# Patient Record
Sex: Female | Born: 1990 | Race: Black or African American | Hispanic: No | Marital: Single | Smoking: Never smoker
Health system: Southern US, Community
[De-identification: ages and names within clinical notes are randomized; demographics above are authoritative.]

## PROBLEM LIST (undated history)

## (undated) DIAGNOSIS — T7840XA Allergy, unspecified, initial encounter: Secondary | ICD-10-CM

## (undated) DIAGNOSIS — E785 Hyperlipidemia, unspecified: Secondary | ICD-10-CM

## (undated) DIAGNOSIS — E119 Type 2 diabetes mellitus without complications: Secondary | ICD-10-CM

## (undated) DIAGNOSIS — K219 Gastro-esophageal reflux disease without esophagitis: Secondary | ICD-10-CM

## (undated) HISTORY — DX: Gastro-esophageal reflux disease without esophagitis: K21.9

## (undated) HISTORY — DX: Hyperlipidemia, unspecified: E78.5

## (undated) HISTORY — DX: Morbid (severe) obesity due to excess calories: E66.01

## (undated) HISTORY — DX: Allergy, unspecified, initial encounter: T78.40XA

## (undated) HISTORY — DX: Type 2 diabetes mellitus without complications: E11.9

---

## 2013-04-18 ENCOUNTER — Encounter (HOSPITAL_COMMUNITY): Payer: Self-pay | Admitting: *Deleted

## 2013-04-18 ENCOUNTER — Emergency Department (HOSPITAL_COMMUNITY)
Admission: EM | Admit: 2013-04-18 | Discharge: 2013-04-18 | Disposition: A | Discharge status: Home / Self Care | Payer: Self-pay | Attending: Emergency Medicine | Admitting: Emergency Medicine

## 2013-04-18 DIAGNOSIS — Z201 Contact with and (suspected) exposure to tuberculosis: Secondary | ICD-10-CM

## 2013-04-18 DIAGNOSIS — Z0389 Encounter for observation for other suspected diseases and conditions ruled out: Secondary | ICD-10-CM | POA: Insufficient documentation

## 2013-04-18 NOTE — ED Provider Notes (Signed)
  History    This chart was scribed for a non-physician practitioner working with Raeford Razor, MD by Jiles Prows, ED scribe. This patient was seen in room WTR9/WTR9 and the patient's care was started at 9:59 PM.  CSN: [id number] Arrival date & time 04/18/13  2019   Chief Complaint  Patient presents with  . ?TB exposure    The history is provided by the patient and medical records. No language interpreter was used.   HPI Comments: Debra [name] is a 22 y.o. female who presents to the Emergency Department complaining of possible exposure to TB.  She states she was in close contact with someone who has TB in the beginning of May at a family event.  She feels well. She denies night sweats, fever, weight loss, headache, diaphoresis, fever, chills, nausea, vomiting, diarrhea, weakness, cough, SOB and any other pain.   History reviewed. No pertinent past medical history. History reviewed. No pertinent past surgical history. No family history on file. History  Substance Use Topics  . Smoking status: Never Smoker   . Smokeless tobacco: Never Used  . Alcohol Use: No   OB History   Grav Para Term Preterm Abortions TAB SAB Ect Mult Living                 Review of Systems  All other systems reviewed and are negative.    Allergies  Review of patient's allergies indicates no known allergies.  Home Medications  No current outpatient prescriptions on file. BP 123/85  Pulse 69  Temp(Src) 98.6 F (37 C) (Oral)  Resp 18  Ht 5\' 6"  (1.676 m)  SpO2 100% Physical Exam  Nursing note and vitals reviewed. Constitutional: She is oriented to person, place, and time. She appears well-developed and well-nourished. No distress.  HENT:  Head: Normocephalic and atraumatic.  Right Ear: External ear normal.  Left Ear: External ear normal.  Nose: Nose normal.  Mouth/Throat: Oropharynx is clear and moist.  Eyes: Conjunctivae are normal.  Neck: Normal range of motion.  Cardiovascular: Normal  rate, regular rhythm and normal heart sounds.   Pulmonary/Chest: Effort normal and breath sounds normal. No stridor. No respiratory distress. She has no wheezes. She has no rales.  Abdominal: Soft. She exhibits no distension.  Musculoskeletal: Normal range of motion.  Neurological: She is alert and oriented to person, place, and time. She has normal strength.  Skin: Skin is warm and dry. She is not diaphoretic. No erythema.  Psychiatric: She has a normal mood and affect. Her behavior is normal.    ED Course  Procedures (including critical care time) DIAGNOSTIC STUDIES: Oxygen Saturation is 100% on RA, normal by my interpretation.    COORDINATION OF CARE: 10:00 PM - Discussed ED treatment with pt at bedside including TB test at health department and pt agrees.     Labs Reviewed - No data to display No results found. 1. Tuberculosis contact     MDM  Patient asymptomatic, physical exam WNL. Discussed that she needs to go to the health department for TB test. Return instructions given. Vital signs stable for discharge. Patient / Family / Caregiver informed of clinical course, understand medical decision-making process, and agree with plan.   I personally performed the services described in this documentation, which was scribed in my presence. The recorded information has been reviewed and is accurate.     Mora Bellman, PA-C 04/18/13 2230

## 2013-04-18 NOTE — ED Notes (Signed)
Pt reports she was at a family event in [place] at the beginning of May and a family member just called to tell her she may have been exposed to TB. Pt denies cough, fever, night sweats or weight loss

## 2013-04-18 NOTE — ED Provider Notes (Signed)
Medical screening examination/treatment/procedure(s) were performed by non-physician practitioner and as supervising physician I was immediately available for consultation/collaboration.  Danira Nylander, MD 04/18/13 2338 

## 2013-04-18 NOTE — ED Notes (Signed)
D/c home with contact info for health dept

## 2015-05-26 ENCOUNTER — Emergency Department (HOSPITAL_COMMUNITY)
Admission: EM | Admit: 2015-05-26 | Discharge: 2015-05-26 | Disposition: A | Discharge status: Home / Self Care | Payer: Self-pay | Attending: Emergency Medicine | Admitting: Emergency Medicine

## 2015-05-26 ENCOUNTER — Encounter (HOSPITAL_COMMUNITY): Payer: Self-pay

## 2015-05-26 DIAGNOSIS — H6691 Otitis media, unspecified, right ear: Secondary | ICD-10-CM | POA: Insufficient documentation

## 2015-05-26 DIAGNOSIS — J029 Acute pharyngitis, unspecified: Secondary | ICD-10-CM | POA: Insufficient documentation

## 2015-05-26 DIAGNOSIS — R109 Unspecified abdominal pain: Secondary | ICD-10-CM | POA: Insufficient documentation

## 2015-05-26 DIAGNOSIS — Z3202 Encounter for pregnancy test, result negative: Secondary | ICD-10-CM | POA: Insufficient documentation

## 2015-05-26 LAB — URINALYSIS, ROUTINE W REFLEX MICROSCOPIC
Bilirubin Urine: NEGATIVE
GLUCOSE, UA: NEGATIVE mg/dL
KETONES UR: NEGATIVE mg/dL
Leukocytes, UA: NEGATIVE
Nitrite: NEGATIVE
PROTEIN: NEGATIVE mg/dL
Specific Gravity, Urine: 1.027 (ref 1.005–1.030)
UROBILINOGEN UA: 4 mg/dL — AB (ref 0.0–1.0)
pH: 7 (ref 5.0–8.0)

## 2015-05-26 LAB — POC URINE PREG, ED: Preg Test, Ur: NEGATIVE

## 2015-05-26 LAB — URINE MICROSCOPIC-ADD ON

## 2015-05-26 MED ORDER — AMOXICILLIN 500 MG PO CAPS: 500.0000 mg | ORAL_CAPSULE | Freq: Three times a day (TID) | ORAL | Status: DC

## 2015-05-26 NOTE — ED Notes (Addendum)
Pt states ear pain, sore pain since Saturday night.  No fever.  Difficult swallowing.  Pt also states flank pain started Wednesday night.  No difficulty urinating. No change in urine color/odor.  Pt also states menstrual period abnormal this month.  Only lasted x 2 days.

## 2015-05-26 NOTE — ED Notes (Signed)
Bed: [facility] Expected date:  Expected time:  Means of arrival:  Comments: Bleach clean

## 2015-05-26 NOTE — ED Provider Notes (Signed)
CSN: [id number]     Arrival date & time 05/26/15  6213 History   First MD Initiated Contact with Patient 05/26/15 [phone]     Chief Complaint  Patient presents with  . Otalgia  . Flank Pain     (Consider location/radiation/quality/duration/timing/severity/associated sxs/prior Treatment) HPI    PCP: No primary care provider on file. Blood pressure 151/74, pulse 103, temperature 98.9 F (37.2 C), temperature source Oral, resp. rate 18, last menstrual period 05/22/2015, SpO2 99 %.  Debra [name] is a 24 y.o.female without any significant PMH presents to the ER with complaints of sore throat, right ear pain and generalized back pain. Her symptoms started on Saturday. 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 She denies  Vaginal discharge, dysuria. She is having abnormal vaginal bleeding. nO fevers. She is having difficulty swallowing due to the pain. She has not tried any medications at home. Her back pain is aching and worse with touching and certain movements. Denies pain at rest.   The patient denies diaphoresis, fever, headache, weakness (general or focal), confusion, change of vision,  neck pain,  aphagia, chest pain, shortness of breath,  back pain, abdominal pains, nausea, vomiting, diarrhea, lower extremity swelling, rash  History reviewed. No pertinent past medical history. History reviewed. No pertinent past surgical history. History reviewed. No pertinent family history. Social History  Substance Use Topics  . Smoking status: Never Smoker   . Smokeless tobacco: Never Used  . Alcohol Use: No   OB History    No data available     Review of Systems  10 Systems reviewed and are negative for acute change except as noted in the HPI.     Allergies  Review of patient's allergies indicates no known allergies.  Home Medications   Prior to Admission medications   Medication Sig Start Date End Date Taking? Authorizing Provider  ibuprofen  (ADVIL,MOTRIN) 200 MG tablet Take 400 mg by mouth every 6 (six) hours as needed for headache, mild pain or moderate pain.   Yes Historical Provider, MD  amoxicillin (AMOXIL) 500 MG capsule Take 1 capsule (500 mg total) by mouth 3 (three) times daily. 05/26/15   Zamarian Scarano Neva Seat, PA-C   BP 151/74 mmHg  Pulse 103  Temp(Src) 98.9 F (37.2 C) (Oral)  Resp 18  SpO2 99%  LMP 05/22/2015 Physical Exam  Constitutional: She appears well-developed and well-nourished. No distress.  HENT:  Head: Normocephalic and atraumatic.  Right Ear: Ear canal normal. Tympanic membrane is injected.  Left Ear: Tympanic membrane and ear canal normal.  Nose: Nose normal.  Mouth/Throat: Posterior oropharyngeal edema and posterior oropharyngeal erythema present. No oropharyngeal exudate.  Eyes: Pupils are equal, round, and reactive to light.  Neck: Normal range of motion. Neck supple.  Cardiovascular: Normal rate and regular rhythm.   Pulmonary/Chest: Effort normal.  Abdominal: Soft. Bowel sounds are normal. She exhibits no distension (exam limited by body habitus). There is no hepatosplenomegaly. There is no tenderness. There is no rigidity, no rebound, no guarding and no CVA tenderness.  Neurological: She is alert.  Skin: Skin is warm and dry.  Nursing note and vitals reviewed.   ED Course  Procedures (including critical care time) Labs Review Labs Reviewed  URINALYSIS, ROUTINE W REFLEX MICROSCOPIC (NOT AT [facility]) - Abnormal; Notable for the following:    Color, Urine AMBER (*)    APPearance CLOUDY (*)    Hgb urine dipstick TRACE (*)    Urobilinogen, UA 4.0 (*)  All other components within normal limits  URINE MICROSCOPIC-ADD ON - Abnormal; Notable for the following:    Squamous Epithelial / LPF MANY (*)    Bacteria, UA MANY (*)    All other components within normal limits  URINE CULTURE  POC URINE PREG, ED    Imaging Review No results found. I have personally reviewed and evaluated these images and  lab results as part of my medical decision-making.   EKG Interpretation None      MDM   Final diagnoses:  Acute right otitis media, recurrence not specified, unspecified otitis media type  Sore throat    Patients urinalysis is normal, small amount of blood in urine which is suspected to be from menstrual cycle.  Patient with infected right ear. No systemic symptoms head N/V/D, headaches, fevers, weakness.  Rx: amoxicillin  Medications - No data to display  24 y.o.Debra [name]'s evaluation in the Emergency Department is complete. It has been determined that no acute conditions requiring further emergency intervention are present at this time. The patient/guardian have been advised of the diagnosis and plan. We have discussed signs and symptoms that warrant return to the ED, such as changes or worsening in symptoms.  Vital signs are stable at discharge. Filed Vitals:   05/26/15 0934  BP: 151/74  Pulse: 103  Temp: 98.9 F (37.2 C)  Resp: 18    Patient/guardian has voiced understanding and agreed to follow-up with the PCP or specialist.    Marlon Pel, PA-C 05/26/15 1135  Blane Ohara, MD 05/28/15 [phone]

## 2015-05-26 NOTE — Discharge Instructions (Signed)
Pharyngitis °Pharyngitis is redness, pain, and swelling (inflammation) of your pharynx.  °CAUSES  °Pharyngitis is usually caused by infection. Most of the time, these infections are from viruses (viral) and are part of a cold. However, sometimes pharyngitis is caused by bacteria (bacterial). Pharyngitis can also be caused by allergies. Viral pharyngitis may be spread from person to person by coughing, sneezing, and personal items or utensils (cups, forks, spoons, toothbrushes). Bacterial pharyngitis may be spread from person to person by more intimate contact, such as kissing.  °SIGNS AND SYMPTOMS  °Symptoms of pharyngitis include:   °· Sore throat.   °· Tiredness (fatigue).   °· Low-grade fever.   °· Headache. °· Joint pain and muscle aches. °· Skin rashes. °· Swollen lymph nodes. °· Plaque-like film on throat or tonsils (often seen with bacterial pharyngitis). °DIAGNOSIS  °Your health care provider will ask you questions about your illness and your symptoms. Your medical history, along with a physical exam, is often all that is needed to diagnose pharyngitis. Sometimes, a rapid strep test is done. Other lab tests may also be done, depending on the suspected cause.  °TREATMENT  °Viral pharyngitis will usually get better in 3-4 days without the use of medicine. Bacterial pharyngitis is treated with medicines that kill germs (antibiotics).  °HOME CARE INSTRUCTIONS  °· Drink enough water and fluids to keep your urine clear or pale yellow.   °· Only take over-the-counter or prescription medicines as directed by your health care provider:   °¨ If you are prescribed antibiotics, make sure you finish them even if you start to feel better.   °¨ Do not take aspirin.   °· Get lots of rest.   °· Gargle with 8 oz of salt water (½ tsp of salt per 1 qt of water) as often as every 1-2 hours to soothe your throat.   °· Throat lozenges (if you are not at risk for choking) or sprays may be used to soothe your throat. °SEEK MEDICAL  CARE IF:  °· You have large, tender lumps in your neck. °· You have a rash. °· You cough up green, yellow-Snader, or bloody spit. °SEEK IMMEDIATE MEDICAL CARE IF:  °· Your neck becomes stiff. °· You drool or are unable to swallow liquids. °· You vomit or are unable to keep medicines or liquids down. °· You have severe pain that does not go away with the use of recommended medicines. °· You have trouble breathing (not caused by a stuffy nose). °MAKE SURE YOU:  °· Understand these instructions. °· Will watch your condition. °· Will get help right away if you are not doing well or get worse. °Document Released: 09/20/2005 Document Revised: 07/11/2013 Document Reviewed: 05/28/2013 °[employer]® Patient Information ©2015 [employer]. This information is not intended to replace advice given to you by your health care provider. Make sure you discuss any questions you have with your health care provider. °Otitis Media °Otitis media is redness, soreness, and inflammation of the middle ear. Otitis media may be caused by allergies or, most commonly, by infection. Often it occurs as a complication of the common cold. °SIGNS AND SYMPTOMS °Symptoms of otitis media may include: °· Earache. °· Fever. °· Ringing in your ear. °· Headache. °· Leakage of fluid from the ear. °DIAGNOSIS °To diagnose otitis media, your health care provider will examine your ear with an otoscope. This is an instrument that allows your health care provider to see into your ear in order to examine your eardrum. Your health care provider also will ask you questions about your   symptoms. °TREATMENT  °Typically, otitis media resolves on its own within 3-5 days. Your health care provider may prescribe medicine to ease your symptoms of pain. If otitis media does not resolve within 5 days or is recurrent, your health care provider may prescribe antibiotic medicines if he or she suspects that a bacterial infection is the cause. °HOME CARE INSTRUCTIONS  °· If you were  prescribed an antibiotic medicine, finish it all even if you start to feel better. °· Take medicines only as directed by your health care provider. °· Keep all follow-up visits as directed by your health care provider. °SEEK MEDICAL CARE IF: °· You have otitis media only in one ear, or bleeding from your nose, or both. °· You notice a lump on your neck. °· You are not getting better in 3-5 days. °· You feel worse instead of better. °SEEK IMMEDIATE MEDICAL CARE IF:  °· You have pain that is not controlled with medicine. °· You have swelling, redness, or pain around your ear or stiffness in your neck. °· You notice that part of your face is paralyzed. °· You notice that the bone behind your ear (mastoid) is tender when you touch it. °MAKE SURE YOU:  °· Understand these instructions. °· Will watch your condition. °· Will get help right away if you are not doing well or get worse. °Document Released: 06/25/2004 Document Revised: 02/04/2014 Document Reviewed: 04/17/2013 °[employer]® Patient Information ©2015 [employer]. This information is not intended to replace advice given to you by your health care provider. Make sure you discuss any questions you have with your health care provider. ° °

## 2015-05-27 LAB — URINE CULTURE: SPECIAL REQUESTS: NORMAL

## 2015-10-05 ENCOUNTER — Encounter (HOSPITAL_COMMUNITY): Payer: Self-pay

## 2015-10-05 ENCOUNTER — Emergency Department (HOSPITAL_COMMUNITY)
Admission: EM | Admit: 2015-10-05 | Discharge: 2015-10-05 | Disposition: A | Discharge status: Home / Self Care | Payer: No Typology Code available for payment source | Attending: Emergency Medicine | Admitting: Emergency Medicine

## 2015-10-05 ENCOUNTER — Emergency Department (HOSPITAL_COMMUNITY): Payer: No Typology Code available for payment source

## 2015-10-05 DIAGNOSIS — S161XXA Strain of muscle, fascia and tendon at neck level, initial encounter: Secondary | ICD-10-CM | POA: Insufficient documentation

## 2015-10-05 DIAGNOSIS — Z3202 Encounter for pregnancy test, result negative: Secondary | ICD-10-CM | POA: Insufficient documentation

## 2015-10-05 DIAGNOSIS — Y9389 Activity, other specified: Secondary | ICD-10-CM | POA: Diagnosis not present

## 2015-10-05 DIAGNOSIS — S0990XA Unspecified injury of head, initial encounter: Secondary | ICD-10-CM | POA: Diagnosis not present

## 2015-10-05 DIAGNOSIS — Y92410 Unspecified street and highway as the place of occurrence of the external cause: Secondary | ICD-10-CM | POA: Diagnosis not present

## 2015-10-05 DIAGNOSIS — Y998 Other external cause status: Secondary | ICD-10-CM | POA: Diagnosis not present

## 2015-10-05 DIAGNOSIS — S199XXA Unspecified injury of neck, initial encounter: Secondary | ICD-10-CM | POA: Diagnosis present

## 2015-10-05 LAB — POC URINE PREG, ED: Preg Test, Ur: NEGATIVE

## 2015-10-05 MED ORDER — NAPROXEN 500 MG PO TABS: 500.0000 mg | ORAL_TABLET | Freq: Two times a day (BID) | ORAL | Status: DC

## 2015-10-05 MED ORDER — METHOCARBAMOL 500 MG PO TABS
500.0000 mg | ORAL_TABLET | Freq: Once | ORAL | Status: DC
  Filled 2015-10-05: qty 1

## 2015-10-05 MED ORDER — NAPROXEN 500 MG PO TABS
500.0000 mg | ORAL_TABLET | Freq: Once | ORAL | Status: AC
  Administered 2015-10-05: 500 mg via ORAL
  Filled 2015-10-05: qty 1

## 2015-10-05 MED ORDER — METHOCARBAMOL 500 MG PO TABS: 500.0000 mg | ORAL_TABLET | Freq: Two times a day (BID) | ORAL | Status: DC

## 2015-10-05 NOTE — Discharge Instructions (Signed)
Cervical Strain and Sprain With Rehab  Cervical strain and sprain are injuries that commonly occur with "whiplash" injuries. Whiplash occurs when the neck is forcefully whipped backward or forward, such as during a motor vehicle accident or during contact sports. The muscles, ligaments, tendons, discs, and nerves of the neck are susceptible to injury when this occurs.  RISK FACTORS  Risk of having a whiplash injury increases if:  · Osteoarthritis of the spine.  · Situations that make head or neck accidents or trauma more likely.  · High-risk sports (football, rugby, wrestling, hockey, auto racing, gymnastics, diving, contact karate, or boxing).  · Poor strength and flexibility of the neck.  · Previous neck injury.  · Poor tackling technique.  · Improperly fitted or padded equipment.  SYMPTOMS   · Pain or stiffness in the front or back of neck or both.  · Symptoms may present immediately or up to 24 hours after injury.  · Dizziness, headache, nausea, and vomiting.  · Muscle spasm with soreness and stiffness in the neck.  · Tenderness and swelling at the injury site.  PREVENTION  · Learn and use proper technique (avoid tackling with the head, spearing, and head-butting; use proper falling techniques to avoid landing on the head).  · Warm up and stretch properly before activity.  · Maintain physical fitness:    Strength, flexibility, and endurance.    Cardiovascular fitness.  · Wear properly fitted and padded protective equipment, such as padded soft collars, for participation in contact sports.  PROGNOSIS   Recovery from cervical strain and sprain injuries is dependent on the extent of the injury. These injuries are usually curable in 1 week to 3 months with appropriate treatment.   RELATED COMPLICATIONS   · Temporary numbness and weakness may occur if the nerve roots are damaged, and this may persist until the nerve has completely healed.  · Chronic pain due to frequent recurrence of symptoms.  · Prolonged healing,  especially if activity is resumed too soon (before complete recovery).  TREATMENT   Treatment initially involves the use of ice and medication to help reduce pain and inflammation. It is also important to perform strengthening and stretching exercises and modify activities that worsen symptoms so the injury does not get worse. These exercises may be performed at home or with a therapist. For patients who experience severe symptoms, a soft, padded collar may be recommended to be worn around the neck.   Improving your posture may help reduce symptoms. Posture improvement includes pulling your chin and abdomen in while sitting or standing. If you are sitting, sit in a firm chair with your buttocks against the back of the chair. While sleeping, try replacing your pillow with a small towel rolled to 2 inches in diameter, or use a cervical pillow or soft cervical collar. Poor sleeping positions delay healing.   For patients with nerve root damage, which causes numbness or weakness, the use of a cervical traction apparatus may be recommended. Surgery is rarely necessary for these injuries. However, cervical strain and sprains that are present at birth (congenital) may require surgery.  MEDICATION   · If pain medication is necessary, nonsteroidal anti-inflammatory medications, such as aspirin and ibuprofen, or other minor pain relievers, such as acetaminophen, are often recommended.  · Do not take pain medication for 7 days before surgery.  · Prescription pain relievers may be given if deemed necessary by your caregiver. Use only as directed and only as much as you need.    HEAT AND COLD:   · Cold treatment (icing) relieves pain and reduces inflammation. Cold treatment should be applied for 10 to 15 minutes every 2 to 3 hours for inflammation and pain and immediately after any activity that aggravates your symptoms. Use ice packs or an ice massage.  · Heat treatment may be used prior to performing the stretching and  strengthening activities prescribed by your caregiver, physical therapist, or athletic trainer. Use a heat pack or a warm soak.  SEEK MEDICAL CARE IF:   · Symptoms get worse or do not improve in 2 weeks despite treatment.  · New, unexplained symptoms develop (drugs used in treatment may produce side effects).  EXERCISES  RANGE OF MOTION (ROM) AND STRETCHING EXERCISES - Cervical Strain and Sprain  These exercises may help you when beginning to rehabilitate your injury. In order to successfully resolve your symptoms, you must improve your posture. These exercises are designed to help reduce the forward-head and rounded-shoulder posture which contributes to this condition. Your symptoms may resolve with or without further involvement from your physician, physical therapist or athletic trainer. While completing these exercises, remember:   · Restoring tissue flexibility helps normal motion to return to the joints. This allows healthier, less painful movement and activity.  · An effective stretch should be held for at least 20 seconds, although you may need to begin with shorter hold times for comfort.  · A stretch should never be painful. You should only feel a gentle lengthening or release in the stretched tissue.  STRETCH- Axial Extensors  · Lie on your back on the floor. You may bend your knees for comfort. Place a rolled-up hand towel or dish towel, about 2 inches in diameter, under the part of your head that makes contact with the floor.  · Gently tuck your chin, as if trying to make a "double chin," until you feel a gentle stretch at the base of your head.  · Hold __________ seconds.  Repeat __________ times. Complete this exercise __________ times per day.   STRETCH - Axial Extension   · Stand or sit on a firm surface. Assume a good posture: chest up, shoulders drawn back, abdominal muscles slightly tense, knees unlocked (if standing) and feet hip width apart.  · Slowly retract your chin so your head slides back  and your chin slightly lowers. Continue to look straight ahead.  · You should feel a gentle stretch in the back of your head. Be certain not to feel an aggressive stretch since this can cause headaches later.  · Hold for __________ seconds.  Repeat __________ times. Complete this exercise __________ times per day.  STRETCH - Cervical Side Bend   · Stand or sit on a firm surface. Assume a good posture: chest up, shoulders drawn back, abdominal muscles slightly tense, knees unlocked (if standing) and feet hip width apart.  · Without letting your nose or shoulders move, slowly tip your right / left ear to your shoulder until your feel a gentle stretch in the muscles on the opposite side of your neck.  · Hold __________ seconds.  Repeat __________ times. Complete this exercise __________ times per day.  STRETCH - Cervical Rotators   · Stand or sit on a firm surface. Assume a good posture: chest up, shoulders drawn back, abdominal muscles slightly tense, knees unlocked (if standing) and feet hip width apart.  · Keeping your eyes level with the ground, slowly turn your head until you feel a gentle stretch along   the back and opposite side of your neck.  · Hold __________ seconds.  Repeat __________ times. Complete this exercise __________ times per day.  RANGE OF MOTION - Neck Circles   · Stand or sit on a firm surface. Assume a good posture: chest up, shoulders drawn back, abdominal muscles slightly tense, knees unlocked (if standing) and feet hip width apart.  · Gently roll your head down and around from the back of one shoulder to the back of the other. The motion should never be forced or painful.  · Repeat the motion 10-20 times, or until you feel the neck muscles relax and loosen.  Repeat __________ times. Complete the exercise __________ times per day.  STRENGTHENING EXERCISES - Cervical Strain and Sprain  These exercises may help you when beginning to rehabilitate your injury. They may resolve your symptoms with or  without further involvement from your physician, physical therapist, or athletic trainer. While completing these exercises, remember:   · Muscles can gain both the endurance and the strength needed for everyday activities through controlled exercises.  · Complete these exercises as instructed by your physician, physical therapist, or athletic trainer. Progress the resistance and repetitions only as guided.  · You may experience muscle soreness or fatigue, but the pain or discomfort you are trying to eliminate should never worsen during these exercises. If this pain does worsen, stop and make certain you are following the directions exactly. If the pain is still present after adjustments, discontinue the exercise until you can discuss the trouble with your clinician.  STRENGTH - Cervical Flexors, Isometric  · Face a wall, standing about 6 inches away. Place a small pillow, a ball about 6-8 inches in diameter, or a folded towel between your forehead and the wall.  · Slightly tuck your chin and gently push your forehead into the soft object. Push only with mild to moderate intensity, building up tension gradually. Keep your jaw and forehead relaxed.  · Hold 10 to 20 seconds. Keep your breathing relaxed.  · Release the tension slowly. Relax your neck muscles completely before you start the next repetition.  Repeat __________ times. Complete this exercise __________ times per day.  STRENGTH- Cervical Lateral Flexors, Isometric   · Stand about 6 inches away from a wall. Place a small pillow, a ball about 6-8 inches in diameter, or a folded towel between the side of your head and the wall.  · Slightly tuck your chin and gently tilt your head into the soft object. Push only with mild to moderate intensity, building up tension gradually. Keep your jaw and forehead relaxed.  · Hold 10 to 20 seconds. Keep your breathing relaxed.  · Release the tension slowly. Relax your neck muscles completely before you start the next  repetition.  Repeat __________ times. Complete this exercise __________ times per day.  STRENGTH - Cervical Extensors, Isometric   · Stand about 6 inches away from a wall. Place a small pillow, a ball about 6-8 inches in diameter, or a folded towel between the back of your head and the wall.  · Slightly tuck your chin and gently tilt your head back into the soft object. Push only with mild to moderate intensity, building up tension gradually. Keep your jaw and forehead relaxed.  · Hold 10 to 20 seconds. Keep your breathing relaxed.  · Release the tension slowly. Relax your neck muscles completely before you start the next repetition.  Repeat __________ times. Complete this exercise __________ times per day.    POSTURE AND BODY MECHANICS CONSIDERATIONS - Cervical Strain and Sprain  Keeping correct posture when sitting, standing or completing your activities will reduce the stress put on different body tissues, allowing injured tissues a chance to heal and limiting painful experiences. The following are general guidelines for improved posture. Your physician or physical therapist will provide you with any instructions specific to your needs. While reading these guidelines, remember:  · The exercises prescribed by your provider will help you have the flexibility and strength to maintain correct postures.  · The correct posture provides the optimal environment for your joints to work. All of your joints have less wear and tear when properly supported by a spine with good posture. This means you will experience a healthier, less painful body.  · Correct posture must be practiced with all of your activities, especially prolonged sitting and standing. Correct posture is as important when doing repetitive low-stress activities (typing) as it is when doing a single heavy-load activity (lifting).  PROLONGED STANDING WHILE SLIGHTLY LEANING FORWARD  When completing a task that requires you to lean forward while standing in one  place for a long time, place either foot up on a stationary 2- to 4-inch high object to help maintain the best posture. When both feet are on the ground, the low back tends to lose its slight inward curve. If this curve flattens (or becomes too large), then the back and your other joints will experience too much stress, fatigue more quickly, and can cause pain.   RESTING POSITIONS  Consider which positions are most painful for you when choosing a resting position. If you have pain with flexion-based activities (sitting, bending, stooping, squatting), choose a position that allows you to rest in a less flexed posture. You would want to avoid curling into a fetal position on your side. If your pain worsens with extension-based activities (prolonged standing, working overhead), avoid resting in an extended position such as sleeping on your stomach. Most people will find more comfort when they rest with their spine in a more neutral position, neither too rounded nor too arched. Lying on a non-sagging bed on your side with a pillow between your knees, or on your back with a pillow under your knees will often provide some relief. Keep in mind, being in any one position for a prolonged period of time, no matter how correct your posture, can still lead to stiffness.  WALKING  Walk with an upright posture. Your ears, shoulders, and hips should all line up.  OFFICE WORK  When working at a desk, create an environment that supports good, upright posture. Without extra support, muscles fatigue and lead to excessive strain on joints and other tissues.  CHAIR:  · A chair should be able to slide under your desk when your back makes contact with the back of the chair. This allows you to work closely.  · The chair's height should allow your eyes to be level with the upper part of your monitor and your hands to be slightly lower than your elbows.  · Body position:    Your feet should make contact with the floor. If this is not  possible, use a foot rest.    Keep your ears over your shoulders. This will reduce stress on your neck and low back.     This information is not intended to replace advice given to you by your health care provider. Make sure you discuss any questions you have with your health care provider.       Document Released: 09/20/2005 Document Revised: 10/11/2014 Document Reviewed: 01/02/2009  [employer]vier Interactive Patient Education ©2016 [employer]
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
## 2015-11-24 ENCOUNTER — Emergency Department (HOSPITAL_COMMUNITY)
Admission: EM | Admit: 2015-11-24 | Discharge: 2015-11-24 | Disposition: A | Discharge status: Home / Self Care | Payer: No Typology Code available for payment source | Attending: Emergency Medicine | Admitting: Emergency Medicine

## 2015-11-24 DIAGNOSIS — H9202 Otalgia, left ear: Secondary | ICD-10-CM | POA: Insufficient documentation

## 2015-11-24 DIAGNOSIS — Z791 Long term (current) use of non-steroidal anti-inflammatories (NSAID): Secondary | ICD-10-CM | POA: Insufficient documentation

## 2015-11-24 DIAGNOSIS — Z79899 Other long term (current) drug therapy: Secondary | ICD-10-CM | POA: Insufficient documentation

## 2015-11-24 DIAGNOSIS — J029 Acute pharyngitis, unspecified: Secondary | ICD-10-CM | POA: Insufficient documentation

## 2015-11-24 LAB — RAPID STREP SCREEN (MED CTR MEBANE ONLY): STREPTOCOCCUS, GROUP A SCREEN (DIRECT): NEGATIVE

## 2015-11-24 MED ORDER — IBUPROFEN 800 MG PO TABS: 800.0000 mg | ORAL_TABLET | Freq: Three times a day (TID) | ORAL | Status: DC

## 2015-11-24 NOTE — Discharge Instructions (Signed)
Your medication as prescribed. Continue drinking fluids at home to remain hydrated. You may also drink hot water or tea with honey in it for symptomatic relief. Please follow up with a primary care provider from the Resource Guide provided below in 3-4 days if your symptoms have note improved. Please return to the Emergency Department if symptoms worsen or new onset of fever, difficulty swallowing resulting in drooling, unable to open her jaw completely, difficulty breathing, facial or neck swelling.   Emergency Department Resource Guide 1) Find a Doctor and Pay Out of Pocket Although you won't have to find out who is covered by your insurance plan, it is a good idea to ask around and get recommendations. You will then need to call the office and see if the doctor you have chosen will accept you as a new patient and what types of options they offer for patients who are self-pay. Some doctors offer discounts or will set up payment plans for their patients who do not have insurance, but you will need to ask so you aren't surprised when you get to your appointment.  2) Contact Your Local Health Department Not all health departments have doctors that can see patients for sick visits, but many do, so it is worth a call to see if yours does. If you don't know where your local health department is, you can check in your phone book. The CDC also has a tool to help you locate your state's health department, and many state websites also have listings of all of their local health departments.  3) Find a Walk-in Clinic If your illness is not likely to be very severe or complicated, you may want to try a walk in clinic. These are popping up all over the country in pharmacies, drugstores, and shopping centers. They're usually staffed by nurse practitioners or physician assistants that have been trained to treat common illnesses and complaints. They're usually fairly quick and inexpensive. However, if you have serious  medical issues or chronic medical problems, these are probably not your best option.  No Primary Care Doctor: - Call Health Connect at  [phone] - they can help you locate a primary care doctor that  accepts your insurance, provides certain services, etc. - Physician Referral Service- [phone]  Chronic Pain Problems: Organization         Address  Phone   Notes  [facility]  [phone] Patients need to be referred by their primary care doctor.   Medication Assistance: Organization         Address  Phone   Notes  [facility] Medication [facility] [address] [place] [place], [place] [address] [phone] --Must be a resident of [facility] -- Must have NO insurance coverage whatsoever (no Medicaid/ Medicare, etc.) -- The pt. MUST have a primary care doctor that directs their care regularly and follows them in the community   [facility]  [phone]   [employer]  [phone]    Agencies that provide inexpensive medical care: Organization         Address  Phone   Notes  [facility]  [phone]   [facility]    [phone]   [facility] [address] [place], [place] [address] [phone]   [facility] [address] [place], [place] [phone]   Planned Parenthood    [phone]   [facility]    (  [phone]   [facility]  [address], [place] Phone:  [phone], Fax:  [phone] Hours of Operation:  9 am - 6 pm, M-F.  Also accepts Medicaid/Medicare and self-pay.  [facility]  [address], [place] Phone: [phone], Fax: [phone]. Hours of Operation:  8:30 am - 5:30 pm, M-F.  Also accepts Medicaid and self-pay.  [facility] [place] [address], [place] Phone: [phone]   [facility] [address] Natasha Bence  [place], [place] [phone], Ext. [phone] Mondays & Thursdays: 7-9 AM.  First 15 patients are seen on a first come, first serve basis.    Medicaid-accepting [facility] Providers:  Organization         Address  Phone   Notes  [facility] [address], [place] [phone] Also accepts self-pay patients.  [facility] [address] Laurell Josephs [place], [place]  [phone]   [facility] [address], [place] [phone]   [facility] [address] [place] [phone]   Renaye Rakers [address], [place]   [phone] Only accepts [place] Access [place] patients after they have their name applied to their card.   Self-Pay (no insurance) in [facility]:  Organization         Address  Phone   Notes  Sickle Cell Patients, [facility] [address] [place], [place] [phone]   [facility] Care [address] [place], [place] [phone]   [facility]  [address], [place] [phone]   [facility]/Dr. Osei-Bonsu  [address], [place] or [address], [place] [phone] Phone number for both [place] and [place] locations is the same.  Urgent Medical and [facility] [address] [place] [phone]   [facility] [address], [place] or [address] [phone]   [facility] [address] [place] [phone], phone; [phone], fax Sees patients 1st and 3rd Saturday of every month.  Must not qualify for public or private insurance (i.e. Medicaid, Medicare, [place] Health Choice, Veterans' Benefits)  Household income should be no more than 200% of the poverty level The clinic cannot treat you if you are pregnant or think you are pregnant  Sexually  transmitted diseases are not treated at the clinic.    Dental Care: Organization         Address  Phone  Notes  [facility] Department of [facility] [address] [place], [place] [phone] Accepts children up to age 72 who are enrolled in [place] or [place] Health Choice; pregnant women with a Medicaid card; and children who have applied for Medicaid or [place] Health Choice, but were declined, whose parents can pay a reduced fee at time of service.  [facility]  [address], [place] [phone] Accepts children up to age 47 who are enrolled in [place] or [place] Health Choice; pregnant women with a Medicaid card; and children who have applied for Medicaid or [place] Health Choice, but were declined, whose parents can pay a reduced fee at time of service.  [facility]  [address] [place], [place] [phone] Patients are seen by appointment only. Walk-ins are not accepted. [facility] will see patients 69 years of age and older. Monday - Tuesday (8am-5pm) Most Wednesdays (8:30-5pm) $30 per visit, cash only  [facility] Adult Dental Access PROGRAM  [address], [facility] [phone] Patients are seen by appointment only. Walk-ins are not accepted. [facility] will see patients 85 years of age and older. One Wednesday Evening (Monthly: Volunteer Based).  $30 per visit, cash only  [employer]  [phone] for adults; Children under age 32, call Graduate Pediatric Dentistry at [phone]. Children aged 68-14, please call [phone] to request a pediatric application.  Dental services are provided in all areas of dental care including fillings, crowns and bridges, complete and partial dentures, implants, gum treatment, root canals, and extractions. Preventive care is also provided. Treatment is provided to both adults and children. Patients are  selected via a lottery and there is often a waiting list.   [facility] [address], [place]  [phone] [email or web address]   [facility] [address] [place], [place] [phone], Ext. [phone] Second and Fourth Thursday of each month, opens at 6:30 AM; Clinic ends at 9 AM.  Patients are seen on a first-come first-served basis, and a limited number are seen during each clinic.   [facility]  [address] Ether Griffins [place], [place] [phone]   Eligibility Requirements You must have lived in [place], [place], or [place] counties for at least the last three months.   You cannot be eligible for state or federal sponsored [place], including [employer], [place], or [employer].   You generally cannot be eligible for healthcare insurance through your employer.    How to apply: Eligibility screenings are held every Tuesday and Wednesday afternoon from 1:00 pm until 4:00 pm. You do not need an appointment for the interview!  [facility] [address] [place], [place] [phone]   [facility] Health Department  [phone]   [facility] Health Department  [phone]   [facility] Health Department  [phone]    Behavioral Health Resources in the Community: Intensive Outpatient Programs Organization         Address  Phone  Notes  [facility] [address] [place], [place] [phone]   [facility] [address] [place], [place] [phone]   [facility] [address] [place], [place]  [phone]   [facility] [address],  [place], [place] [phone] or [phone]   Substance Abuse Resources Organization         Address  Phone  Notes  [facility]  [phone]   [facility]  [phone]   [facility] [place]  [phone]   Floydene Flock  6076286889     Residential & Outpatient Substance Abuse Program  [phone]   Psychological Services Organization         Address  Phone  Notes  [facility]  [phone]   [facility]  [phone]   [facility] [address] [place] [phone] or [phone]    Mobile Crisis Teams Organization         Address  Phone  Notes  [facility], Mobile Crisis Care Unit  [phone]   [facility]  [address]  [place], [place] [phone]   Doristine Locks [address] [place] [phone]    Self-Help/Support Groups Organization         Address  Phone             Notes  [facility] [place] - variety of support groups  336- I7437963 Call for more information  Narcotics Anonymous (NA), [facility] [address], [employer] [place]  2 meetings at this location   Statistician         Address  Phone  Notes  [facility] [address],    [place]  [phone]   [facility]  [address] [place] [address], [place], [place] [phone]   [facility] [address] [place], [place] [phone] Admissions: 8am-3pm M-F  [facility] [address],    [place], [place] [phone]   [facility] [address] [place], [place], [place] [phone]   [facility] [address],  [place], [place] [phone]   [facility] - Intensive Outpatient [address]ure[address] Josephs [address], [place], [place] [phone]   [facility] ([facility] [address] [place]  [place], [place] [phone] or 2487789878   [facility] ([facility]) [address], [place], [place] [phone] Accepts Medicaid  [facility] [address],  [place] [phone] Substance Abuse/Addiction Treatment   [facility] Organization         Address  Phone  Notes  [facility]  [phone]   Angie Fava, PhD [address] Ervin Knack [place], [place]   [phone] or [phone]   [facility] Behavioral   [address] [place], [place] [phone]   [facility] [address], [place], [place] [phone] Insurance/Medicaid/sponsorship through [facility] and Families [address]                                    [place], [place] [phone] Therapy/tele-psych/case  [facility] [address], [place] [phone]    Dr. Lolly Mustache  [phone]   [facility]  [employer] [facility] Dept. 1) [address] [place] 2) [address] [place] 3)  [address], [place] [phone]  [phone]   [facility] Child Abuse Hotline 651-438-5922 or [phone] (After Hours)

## 2015-11-24 NOTE — ED Notes (Signed)
Pt states that she has had a sore throat and L ear pain x 2 days. Has not tried anything OTC for pain relief. Alert and oriented.

## 2015-11-24 NOTE — ED Provider Notes (Signed)
CSN: [id number]     Arrival date & time 11/24/15  1709 History  By signing my name below, I, Debra [name], attest that this documentation has been prepared under the direction and in the presence of Barrett Henle, PA-C. Electronically Signed: Gonzella [name], ED Scribe. 11/24/2015. 6:12 PM.    Chief Complaint  Patient presents with  . Sore Throat  . Otalgia   The history is provided by the patient. No language interpreter was used.    HPI Comments: Debra [name] is a 25 y.o. female who presents to the Emergency Department complaining of sudden onset, gradually worsening sore throat which began yesterday and has worsened today. Pt also complains of pain with swallowing secondary to soar throat, and otalgia to her left ear which began yesterday. 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 Pt denies fever, nasal congestion, rhinorrhea, cough, difficulty breathing, facial swelling, change in hearing, ear drainage, and difficulty swallowing. Pt is otherwise healthy. Denies taking any medications prior to arrival.  No past medical history on file. No past surgical history on file. No family history on file. Social History  Substance Use Topics  . Smoking status: Never Smoker   . Smokeless tobacco: Never Used  . Alcohol Use: No   OB History    No data available     Review of Systems  Constitutional: Negative for fever.  HENT: Positive for ear pain and sore throat. Negative for congestion, ear discharge, facial swelling, hearing loss, rhinorrhea and trouble swallowing.   Respiratory: Negative for cough and shortness of breath.     Allergies  Review of patient's allergies indicates no known allergies.  Home Medications   Prior to Admission medications   Medication Sig Start Date End Date Taking? Authorizing Provider  amoxicillin (AMOXIL) 500 MG capsule Take 1 capsule (500 mg total) by mouth 3 (three) times daily. Patient not taking: Reported on 10/05/2015  05/26/15   Marlon Pel, PA-C  ibuprofen (ADVIL,MOTRIN) 800 MG tablet Take 1 tablet (800 mg total) by mouth 3 (three) times daily. 11/24/15   Barrett Henle, PA-C  methocarbamol (ROBAXIN) 500 MG tablet Take 1 tablet (500 mg total) by mouth 2 (two) times daily. 10/05/15   April Palumbo, MD  naproxen (NAPROSYN) 500 MG tablet Take 1 tablet (500 mg total) by mouth 2 (two) times daily. 10/05/15   April Palumbo, MD   BP 160/92 mmHg  Pulse 95  Temp(Src) 98 F (36.7 C) (Oral)  Resp 16  SpO2 99%  LMP 07/24/2015   Physical Exam  Constitutional: She is oriented to person, place, and time. She appears well-developed and well-nourished.  HENT:  Head: Normocephalic and atraumatic.  Right Ear: Tympanic membrane normal.  Left Ear: Tympanic membrane normal.  Nose: Nose normal. Right sinus exhibits no maxillary sinus tenderness and no frontal sinus tenderness. Left sinus exhibits no maxillary sinus tenderness and no frontal sinus tenderness.  Mouth/Throat: Uvula is midline and mucous membranes are normal. Oropharyngeal exudate ( white) and posterior oropharyngeal erythema present. No posterior oropharyngeal edema or tonsillar abscesses.  Eyes: Conjunctivae and EOM are normal. Right eye exhibits no discharge. Left eye exhibits no discharge. No scleral icterus.  Neck: Normal range of motion. Neck supple.  Cardiovascular: Normal rate, regular rhythm, normal heart sounds and intact distal pulses.   Pulmonary/Chest: Effort normal and breath sounds normal. No respiratory distress. She has no wheezes. She has no rales. She exhibits no tenderness.  Abdominal: Soft.  Musculoskeletal: Normal range of  motion.  Lymphadenopathy:    She has no cervical adenopathy.  Neurological: She is alert and oriented to person, place, and time.  Skin: Skin is warm and dry.  Psychiatric: She has a normal mood and affect.  Nursing note and vitals reviewed.   ED Course  Procedures  DIAGNOSTIC STUDIES: Oxygen Saturation  is 99% on RA, normal by my interpretation.    COORDINATION OF CARE: 6:09 PM Will order strep test.  Labs Review Labs Reviewed  RAPID STREP SCREEN (NOT AT [facility])  CULTURE, GROUP A STREP [facility])    I have personally reviewed and evaluated these lab results as part of my medical decision-making.   MDM   Final diagnoses:  Sore throat    Pt afebrile with tonsillar exudate, negative strep. Presents with no cervical lymphadenopathy, & positive dysphagia; diagnosis of viral pharyngitis. No abx indicated. DC w symptomatic tx for pain  Pt does not appear dehydrated, but did discuss importance of water rehydration. Presentation non concerning for PTA or infxn spread to soft tissue. No trismus or uvula deviation. Specific return precautions discussed. Pt able to drink water in ED without difficulty with intact air way. Recommended PCP follow up.  I personally performed the services described in this documentation, which was scribed in my presence. The recorded information has been reviewed and is accurate.    Debra [name], [place] 11/24/15 1917  [name], MD 11/24/15 [phone]

## 2015-11-27 LAB — CULTURE, GROUP A STREP (THRC)

## 2016-08-09 ENCOUNTER — Encounter (HOSPITAL_COMMUNITY): Payer: Self-pay | Admitting: *Deleted

## 2016-08-09 ENCOUNTER — Emergency Department (HOSPITAL_COMMUNITY)
Admission: EM | Admit: 2016-08-09 | Discharge: 2016-08-09 | Disposition: A | Discharge status: Home / Self Care | Payer: Self-pay | Attending: Emergency Medicine | Admitting: Emergency Medicine

## 2016-08-09 DIAGNOSIS — J020 Streptococcal pharyngitis: Secondary | ICD-10-CM | POA: Insufficient documentation

## 2016-08-09 LAB — RAPID STREP SCREEN (MED CTR MEBANE ONLY): STREPTOCOCCUS, GROUP A SCREEN (DIRECT): POSITIVE — AB

## 2016-08-09 MED ORDER — PENICILLIN V POTASSIUM 500 MG PO TABS
500.0000 mg | ORAL_TABLET | Freq: Once | ORAL | Status: AC
  Administered 2016-08-09: 500 mg via ORAL
  Filled 2016-08-09: qty 1

## 2016-08-09 MED ORDER — IBUPROFEN 600 MG PO TABS: 600.0000 mg | ORAL_TABLET | Freq: Four times a day (QID) | ORAL | 0 refills | Status: DC | PRN

## 2016-08-09 MED ORDER — PENICILLIN V POTASSIUM 500 MG PO TABS: 500.0000 mg | ORAL_TABLET | Freq: Four times a day (QID) | ORAL | 0 refills | Status: AC

## 2016-08-09 MED ORDER — ACETAMINOPHEN 500 MG PO TABS
1000.0000 mg | ORAL_TABLET | Freq: Once | ORAL | Status: AC
  Administered 2016-08-09: 1000 mg via ORAL
  Filled 2016-08-09: qty 2

## 2016-08-09 NOTE — ED Provider Notes (Signed)
[facility]-EMERGENCY DEPT Provider Note   CSN: [id number] Arrival date & time: 08/09/16  [id number]     History   Chief Complaint Chief Complaint  Patient presents with  . Sore Throat  . Otalgia    HPI Debra [name] is a 25 y.o. female.  HPI Debra [name] is a 25 y.o. female presents to emergency department complaining of sore throat, bilateral ear pain, headache, body aches. Pt states symptoms began 2 days ago. State she did not try anything for her symptoms at home. Denies congestion. No cough. No neck pain or stiffness. No ill contacts. Nothing making her symptoms better or worse. No n/v/d. No chest pain or abdominal pain. 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  History reviewed. No pertinent past medical history.  There are no active problems to display for this patient.   History reviewed. No pertinent surgical history.  OB History    No data available       Home Medications    Prior to Admission medications   Medication Sig Start Date End Date Taking? Authorizing Provider  amoxicillin (AMOXIL) 500 MG capsule Take 1 capsule (500 mg total) by mouth 3 (three) times daily. Patient not taking: Reported on 10/05/2015 05/26/15   Marlon Peliffany Greene, PA-C  ibuprofen (ADVIL,MOTRIN) 800 MG tablet Take 1 tablet (800 mg total) by mouth 3 (three) times daily. 11/24/15   Barrett HenleNicole Elizabeth Nadeau, PA-C  methocarbamol (ROBAXIN) 500 MG tablet Take 1 tablet (500 mg total) by mouth 2 (two) times daily. 10/05/15   April Palumbo, MD  naproxen (NAPROSYN) 500 MG tablet Take 1 tablet (500 mg total) by mouth 2 (two) times daily. 10/05/15   April Palumbo, MD    Family History No family history on file.  Social History Social History  Substance Use Topics  . Smoking status: Never Smoker  . Smokeless tobacco: Never Used  . Alcohol use No     Allergies   Patient has no known allergies.   Review of Systems Review of Systems  Constitutional: Positive for  chills.  HENT: Positive for ear pain and sore throat. Negative for congestion and ear discharge.   Respiratory: Negative for cough, chest tightness and shortness of breath.   Cardiovascular: Negative for chest pain, palpitations and leg swelling.  Gastrointestinal: Negative for abdominal pain, diarrhea, nausea and vomiting.  Genitourinary: Negative for dysuria and flank pain.  Musculoskeletal: Positive for myalgias. Negative for arthralgias, neck pain and neck stiffness.  Skin: Negative for rash.  Neurological: Positive for headaches. Negative for dizziness and weakness.  All other systems reviewed and are negative.    Physical Exam Updated Vital Signs BP 133/85   Pulse (!) 125   Temp 98.7 F (37.1 C) (Oral)   Resp 20   Ht 5\' 6"  (1.676 m)   Wt 124.2 kg   SpO2 97%   BMI 44.19 kg/m   Physical Exam  Constitutional: She appears well-developed and well-nourished. No distress.  HENT:  Head: Normocephalic.  External ear, ear canal, TMs normal bilaterally. Oropharynx erythematous, with exudate. Nose normal  Eyes: Conjunctivae are normal.  Neck: Neck supple.  Cardiovascular: Regular rhythm and normal heart sounds.  Tachycardia present.   Pulmonary/Chest: Effort normal and breath sounds normal. No respiratory distress. She has no wheezes. She has no rales.  Abdominal: Soft. Bowel sounds are normal. She exhibits no distension. There is no tenderness. There is no rebound.  Musculoskeletal: She exhibits no edema.  Lymphadenopathy:  She has no cervical adenopathy.  Neurological: She is alert.  Skin: Skin is warm and dry.  Psychiatric: She has a normal mood and affect. Her behavior is normal.  Nursing note and vitals reviewed.    ED Treatments / Results  Labs (all labs ordered are listed, but only abnormal results are displayed) Labs Reviewed  RAPID STREP SCREEN (NOT AT [facility]) - Abnormal; Notable for the following:       Result Value   Streptococcus, Group A Screen (Direct)  POSITIVE (*)    All other components within normal limits    EKG  EKG Interpretation None       Radiology No results found.  Procedures Procedures (including critical care time)  Medications Ordered in ED Medications  acetaminophen (TYLENOL) tablet 1,000 mg (not administered)     Initial Impression / Assessment and Plan / ED Course  I have reviewed the triage vital signs and the nursing notes.  Pertinent labs & imaging results that were available during my care of the patient were reviewed by me and considered in my medical decision making (see chart for details).  Clinical Course     Patient with sore throat, fever, positive rapid strep. She is tachycardic, febrile 102.4. Tylenol given for fever. Temperature down to 99. Heart rate improved, however still continues to be tachycardic. Instructed to increase fluids. She has no difficulty swallowing. No evidence of peritonsillar retropharyngeal abscess. She is nontoxic appearing. Home with Tylenol, ibuprofen, penicillin. She was offered a penicillin shot, however she refused. Instructed to do saltwater gargles. Close follow with primary care doctor. Return precautions discussed.   Final Clinical Impressions(s) / ED Diagnoses   Final diagnoses:  Strep pharyngitis    New Prescriptions New Prescriptions   IBUPROFEN (ADVIL,MOTRIN) 600 MG TABLET    Take 1 tablet (600 mg total) by mouth every 6 (six) hours as needed.   PENICILLIN V POTASSIUM (VEETID) 500 MG TABLET    Take 1 tablet (500 mg total) by mouth 4 (four) times daily.     Jaynie Crumbleatyana Havana Baldwin, PA-C 08/09/16 0827    Jaynie Crumbleatyana Awanda Wilcock, PA-C 08/09/16 [id number]    Laurence Spatesachel Morgan Little, MD 08/09/16 1547

## 2016-08-09 NOTE — ED Triage Notes (Signed)
Pt states that she has had a sore throat and bilateral ear pain for 2 days; pt denies URI sx

## 2016-08-09 NOTE — Discharge Instructions (Signed)
Drink plenty of fluids. Penicillin as prescribed until all gone. Salt water gargles. Tylenol and motrin for fever and sore throat. Follow up with family doctor.

## 2016-08-10 ENCOUNTER — Emergency Department (HOSPITAL_COMMUNITY)
Admission: EM | Admit: 2016-08-10 | Discharge: 2016-08-10 | Disposition: A | Discharge status: Home / Self Care | Payer: Self-pay | Attending: Emergency Medicine | Admitting: Emergency Medicine

## 2016-08-10 ENCOUNTER — Encounter (HOSPITAL_COMMUNITY): Payer: Self-pay

## 2016-08-10 DIAGNOSIS — J020 Streptococcal pharyngitis: Secondary | ICD-10-CM | POA: Insufficient documentation

## 2016-08-10 MED ORDER — ONDANSETRON 8 MG PO TBDP
8.0000 mg | ORAL_TABLET | Freq: Once | ORAL | Status: AC
  Administered 2016-08-10: 8 mg via ORAL
  Filled 2016-08-10: qty 1

## 2016-08-10 MED ORDER — PENICILLIN G BENZATHINE 1200000 UNIT/2ML IM SUSP
1.2000 10*6.[IU] | Freq: Once | INTRAMUSCULAR | Status: AC
  Administered 2016-08-10: 1.2 10*6.[IU] via INTRAMUSCULAR
  Filled 2016-08-10: qty 2

## 2016-08-10 NOTE — ED Triage Notes (Signed)
Pt was seen and dx with strep yesterday, she continues to feels worse and it's making her vomit, pt is taking her antibiotics

## 2016-08-10 NOTE — ED Provider Notes (Signed)
[facility]ER[facility] DEPT Provider Note   CSN: [id number] Arrival date & time: 08/10/16  0018  By signing my name below, I, Debra [name], attest that this documentation has been prepared under the direction and in the presence of Zadie Rhineonald Grettel Rames, MD. Electronically Signed: Angelene GiovanniEmmanuella [name], ED Scribe. 08/10/16. 4:07 AM.   History   Chief Complaint Chief Complaint  Patient presents with  . Sore Throat    HPI Comments: Debra [name] is a 25 y.o. female who presents to the Emergency Department complaining of multiple episodes of moderate non-bloody vomiting and non-bloody diarrhea onset yesterday s/p taking Penicillin. Pt was evaluated in the ED yesterday for persistent moderate sore throat, bilateral ear pain, headache and generalized body aches where she had a positive strep screen. She was discharged with ibuprofen and penicillin v potassium at that time. She states that she has returned because she has been vomiting and having diarrhea after taking the penicillin and ibuprofen. Pt has NKDA. She denies any fever, chills, or any other symptoms.    The history is provided by the patient. No language interpreter was used.  Sore Throat  This is a new problem. The current episode started yesterday. The problem has been gradually worsening. Nothing aggravates the symptoms. Nothing relieves the symptoms. Treatments tried: Ibuprofen and Penicillin. The treatment provided no relief.     PMH - none OB History    No data available       Home Medications    Prior to Admission medications   Medication Sig Start Date End Date Taking? Authorizing Provider  amoxicillin (AMOXIL) 500 MG capsule Take 1 capsule (500 mg total) by mouth 3 (three) times daily. Patient not taking: Reported on 10/05/2015 05/26/15   Marlon Peliffany Greene, PA-C  ibuprofen (ADVIL,MOTRIN) 600 MG tablet Take 1 tablet (600 mg total) by mouth every 6 (six) hours as needed. 08/09/16   Tatyana Kirichenko, PA-C  ibuprofen (ADVIL,MOTRIN)  800 MG tablet Take 1 tablet (800 mg total) by mouth 3 (three) times daily. 11/24/15   Barrett HenleNicole Elizabeth Nadeau, PA-C  methocarbamol (ROBAXIN) 500 MG tablet Take 1 tablet (500 mg total) by mouth 2 (two) times daily. 10/05/15   April Palumbo, MD  naproxen (NAPROSYN) 500 MG tablet Take 1 tablet (500 mg total) by mouth 2 (two) times daily. 10/05/15   April Palumbo, MD  penicillin v potassium (VEETID) 500 MG tablet Take 1 tablet (500 mg total) by mouth 4 (four) times daily. 08/09/16 08/19/16  Jaynie Crumbleatyana Kirichenko, PA-C    Family History History reviewed. No pertinent family history.  Social History Social History  Substance Use Topics  . Smoking status: Never Smoker  . Smokeless tobacco: Never Used  . Alcohol use No     Allergies   Patient has no known allergies.   Review of Systems Review of Systems  Constitutional: Negative for chills and fever.  HENT: Positive for sore throat.   Gastrointestinal: Positive for nausea and vomiting.  Musculoskeletal: Positive for myalgias.     Physical Exam Updated Vital Signs BP 112/89 (BP Location: Left Arm)   Pulse 108   Temp 98.7 F (37.1 C) (Oral)   Resp 17   Ht 5\' 6"  (1.676 m)   Wt 273 lb (123.8 kg)   SpO2 97%   BMI 44.06 kg/m   Physical Exam  Nursing note and vitals reviewed.  CONSTITUTIONAL: Well developed/well nourished HEAD: Normocephalic/atraumatic EYES: EOMI ENMT: Mucous membranes moist; uvula midline; exudates and erythema noted to tonsils, no drooling, no stridor NECK: supple no meningeal signs,  no significant cervical lymphadenopathy CV: S1/S2 noted, no murmurs/rubs/gallops noted LUNGS: Lungs are clear to auscultation bilaterally, no apparent distress NEURO: Pt is awake/alert/appropriate, moves all extremitiesx4.  No facial droop.   SKIN: warm, color normal PSYCH: no abnormalities of mood noted, alert and oriented to situation   ED Treatments / Results  DIAGNOSTIC STUDIES: Oxygen Saturation is 97% on RA, normal by my  interpretation.    COORDINATION OF CARE: 4:06 AM- Pt advised of plan for treatment and pt agrees. Pt will receive Penicillin IM and Zofran.   Labs (all labs ordered are listed, but only abnormal results are displayed) Labs Reviewed - No data to display  EKG  EKG Interpretation None       Radiology No results found.  Procedures Procedures (including critical care time)  Medications Ordered in ED Medications - No data to display   Initial Impression / Assessment and Plan / ED Course  Zadie Rhineonald Jaydalynn Olivero, MD has reviewed the triage vital signs and the nursing notes.    Clinical Course     Pt taking PO She is nontoxic Advised her to stop home antibiotics as she was given IM injection here  Final Clinical Impressions(s) / ED Diagnoses   Final diagnoses:  Pharyngitis due to Streptococcus species    New Prescriptions New Prescriptions   No medications on file   I personally performed the services described in this documentation, which was scribed in my presence. The recorded information has been reviewed and is accurate.        Zadie Rhineonald Keynan Heffern, MD 08/10/16 [phone]

## 2017-04-25 ENCOUNTER — Encounter (HOSPITAL_COMMUNITY): Payer: Self-pay | Admitting: Emergency Medicine

## 2017-04-25 DIAGNOSIS — Z79899 Other long term (current) drug therapy: Secondary | ICD-10-CM | POA: Diagnosis not present

## 2017-04-25 DIAGNOSIS — K760 Fatty (change of) liver, not elsewhere classified: Secondary | ICD-10-CM | POA: Diagnosis not present

## 2017-04-25 DIAGNOSIS — R1084 Generalized abdominal pain: Secondary | ICD-10-CM | POA: Diagnosis present

## 2017-04-25 DIAGNOSIS — A084 Viral intestinal infection, unspecified: Secondary | ICD-10-CM | POA: Insufficient documentation

## 2017-04-25 DIAGNOSIS — R1011 Right upper quadrant pain: Secondary | ICD-10-CM | POA: Diagnosis not present

## 2017-04-25 LAB — COMPREHENSIVE METABOLIC PANEL
ALBUMIN: 4.1 g/dL (ref 3.5–5.0)
ALT: 27 U/L (ref 14–54)
ANION GAP: 9 (ref 5–15)
AST: 26 U/L (ref 15–41)
Alkaline Phosphatase: 57 U/L (ref 38–126)
BUN: 15 mg/dL (ref 6–20)
CALCIUM: 9 mg/dL (ref 8.9–10.3)
CHLORIDE: 104 mmol/L (ref 101–111)
CO2: 26 mmol/L (ref 22–32)
Creatinine, Ser: 0.69 mg/dL (ref 0.44–1.00)
GFR calc non Af Amer: >60 mL/min (ref >60)
Glucose, Bld: 98 mg/dL (ref 65–99)
POTASSIUM: 4 mmol/L (ref 3.5–5.1)
SODIUM: 139 mmol/L (ref 135–145)
Total Bilirubin: 0.5 mg/dL (ref 0.3–1.2)
Total Protein: 8 g/dL (ref 6.5–8.1)

## 2017-04-25 LAB — URINALYSIS, ROUTINE W REFLEX MICROSCOPIC
Bilirubin Urine: NEGATIVE
GLUCOSE, UA: NEGATIVE mg/dL
Hgb urine dipstick: NEGATIVE
Ketones, ur: NEGATIVE mg/dL
LEUKOCYTES UA: NEGATIVE
Nitrite: NEGATIVE
PROTEIN: NEGATIVE mg/dL
SPECIFIC GRAVITY, URINE: 1.028 (ref 1.005–1.030)
pH: 5 (ref 5.0–8.0)

## 2017-04-25 LAB — CBC
HEMATOCRIT: 43 % (ref 36.0–46.0)
HEMOGLOBIN: 13.5 g/dL (ref 12.0–15.0)
MCH: 23.7 pg — ABNORMAL LOW (ref 26.0–34.0)
MCHC: 31.4 g/dL (ref 30.0–36.0)
MCV: 75.4 fL — AB (ref 78.0–100.0)
Platelets: 460 10*3/uL — ABNORMAL HIGH (ref 150–400)
RBC: 5.7 MIL/uL — AB (ref 3.87–5.11)
RDW: 14.1 % (ref 11.5–15.5)
WBC: 10.8 10*3/uL — ABNORMAL HIGH (ref 4.0–10.5)

## 2017-04-25 LAB — LIPASE, BLOOD: LIPASE: 20 U/L (ref 11–51)

## 2017-04-25 LAB — POC URINE PREG, ED: Preg Test, Ur: NEGATIVE

## 2017-04-25 MED ORDER — ONDANSETRON 4 MG PO TBDP
4.0000 mg | ORAL_TABLET | Freq: Once | ORAL | Status: AC | PRN
  Administered 2017-04-25: 4 mg via ORAL
  Filled 2017-04-25: qty 1

## 2017-04-25 NOTE — ED Triage Notes (Signed)
Pt reports having abd pain along with nausea, vomiting, and diarrhea. Pt states symptoms began last night.

## 2017-04-26 ENCOUNTER — Emergency Department (HOSPITAL_COMMUNITY)
Admission: EM | Admit: 2017-04-26 | Discharge: 2017-04-26 | Disposition: A | Discharge status: Home / Self Care | Payer: BLUE CROSS/BLUE SHIELD | Attending: Emergency Medicine | Admitting: Emergency Medicine

## 2017-04-26 ENCOUNTER — Emergency Department (HOSPITAL_COMMUNITY): Payer: BLUE CROSS/BLUE SHIELD

## 2017-04-26 DIAGNOSIS — R112 Nausea with vomiting, unspecified: Secondary | ICD-10-CM

## 2017-04-26 DIAGNOSIS — R197 Diarrhea, unspecified: Secondary | ICD-10-CM

## 2017-04-26 DIAGNOSIS — R1011 Right upper quadrant pain: Secondary | ICD-10-CM

## 2017-04-26 DIAGNOSIS — R109 Unspecified abdominal pain: Secondary | ICD-10-CM

## 2017-04-26 DIAGNOSIS — A084 Viral intestinal infection, unspecified: Secondary | ICD-10-CM

## 2017-04-26 DIAGNOSIS — K760 Fatty (change of) liver, not elsewhere classified: Secondary | ICD-10-CM

## 2017-04-26 MED ORDER — KETOROLAC TROMETHAMINE 30 MG/ML IJ SOLN
15.0000 mg | Freq: Once | INTRAMUSCULAR | Status: DC
  Filled 2017-04-26: qty 1

## 2017-04-26 MED ORDER — KETOROLAC TROMETHAMINE 30 MG/ML IJ SOLN
30.0000 mg | Freq: Once | INTRAMUSCULAR | Status: AC
  Administered 2017-04-26: 30 mg via INTRAMUSCULAR

## 2017-04-26 MED ORDER — ONDANSETRON 4 MG PO TBDP: 4.0000 mg | ORAL_TABLET | Freq: Three times a day (TID) | ORAL | 0 refills | Status: DC | PRN

## 2017-04-26 MED ORDER — SODIUM CHLORIDE 0.9 % IV BOLUS (SEPSIS): 1000.0000 mL | Freq: Once | INTRAVENOUS | Status: DC

## 2017-04-26 NOTE — ED Provider Notes (Signed)
[facility]PT Provider Note   CSN: [id number] Arrival date & time: 04/25/17  2000     History   Chief Complaint Chief Complaint  Patient presents with  . Abdominal Pain    HPI Debra [name] is a 26 y.o. female who presents to the ED with complaints of generalized migratory abdominal pain 1 day with associated nausea, one episode of nonbloody nonbilious emesis, and 5 episodes of nonbloody watery diarrhea. She describes the pain as 10/10 generalized but migratory, crampy, intermittent every few minutes abd pain, that occasionally radiates to her lower back, worsens with trying to drink anything, and with no treatments tried prior to arrival. She denies fevers, chills, CP, SOB, constipation, obstipation, melena, hematochezia, hematemesis, hematuria, dysuria, vaginal bleeding/discharge, myalgias, arthralgias, numbness, tingling, focal weakness, or any other complaints at this time. Denies recent travel, sick contacts, suspicious food intake, EtOH use, NSAID use, recent abx, or prior abd surgeries. No known medical problems.    The history is provided by the patient and medical records. No language interpreter was used.  Abdominal Pain   This is a new problem. The current episode started yesterday. The problem occurs hourly. The problem has not changed since onset.The pain is associated with an unknown factor. The pain is located in the generalized abdominal region. The quality of the pain is cramping. The pain is at a severity of 10/10. The pain is moderate. Associated symptoms include diarrhea, nausea and vomiting. Pertinent negatives include fever, flatus, hematochezia, melena, constipation, dysuria, hematuria, arthralgias and myalgias. Exacerbated by: drinking. Nothing relieves the symptoms.    History reviewed. No pertinent past medical history.  There are no active problems to display for this patient.   History reviewed. No pertinent surgical history.  OB History    No data  available       Home Medications    Prior to Admission medications   Medication Sig Start Date End Date Taking? Authorizing Provider  amoxicillin (AMOXIL) 500 MG capsule Take 1 capsule (500 mg total) by mouth 3 (three) times daily. Patient not taking: Reported on 10/05/2015 05/26/15   Marlon Pel, PA-C  ibuprofen (ADVIL,MOTRIN) 600 MG tablet Take 1 tablet (600 mg total) by mouth every 6 (six) hours as needed. 08/09/16   Kirichenko, Tatyana, PA-C  ibuprofen (ADVIL,MOTRIN) 800 MG tablet Take 1 tablet (800 mg total) by mouth 3 (three) times daily. 11/24/15   Barrett Henle, PA-C  methocarbamol (ROBAXIN) 500 MG tablet Take 1 tablet (500 mg total) by mouth 2 (two) times daily. 10/05/15   Palumbo, April, MD  naproxen (NAPROSYN) 500 MG tablet Take 1 tablet (500 mg total) by mouth 2 (two) times daily. 10/05/15   Palumbo, April, MD    Family History History reviewed. No pertinent family history.  Social History Social History  Substance Use Topics  . Smoking status: Never Smoker  . Smokeless tobacco: Never Used  . Alcohol use No     Allergies   Patient has no known allergies.   Review of Systems Review of Systems  Constitutional: Negative for chills and fever.  Respiratory: Negative for shortness of breath.   Cardiovascular: Negative for chest pain.  Gastrointestinal: Positive for abdominal pain, diarrhea, nausea and vomiting. Negative for blood in stool, constipation, flatus, hematochezia and melena.  Genitourinary: Negative for dysuria, hematuria, vaginal bleeding and vaginal discharge.  Musculoskeletal: Negative for arthralgias and myalgias.  Skin: Negative for color change.  Allergic/Immunologic: Negative for immunocompromised state.  Neurological: Negative for weakness and numbness.  Psychiatric/Behavioral: Negative  for confusion.   All other systems reviewed and are negative for acute change except as noted in the HPI.    Physical Exam Updated Vital Signs BP (!)  137/93 (BP Location: Left Arm)   Pulse (!) 105   Temp 98.7 F (37.1 C) (Oral)   Resp 18   Ht 5\' 6"  (1.676 m)   Wt 129.5 kg (285 lb 6.4 oz)   LMP  (LMP Unknown)   SpO2 100%   BMI 46.06 kg/m   Physical Exam  Constitutional: She is oriented to person, place, and time. Vital signs are normal. She appears well-developed and well-nourished.  Non-toxic appearance. No distress.  Afebrile, nontoxic, NAD  HENT:  Head: Normocephalic and atraumatic.  Mouth/Throat: Oropharynx is clear and moist and mucous membranes are normal.  Eyes: Conjunctivae and EOM are normal. Right eye exhibits no discharge. Left eye exhibits no discharge.  Neck: Normal range of motion. Neck supple.  Cardiovascular: Normal rate, regular rhythm, normal heart sounds and intact distal pulses.  Exam reveals no gallop and no friction rub.   No murmur heard. Tachycardic in triage, which resolved upon exam  Pulmonary/Chest: Effort normal and breath sounds normal. No respiratory distress. She has no decreased breath sounds. She has no wheezes. She has no rhonchi. She has no rales.  Abdominal: Soft. Normal appearance and bowel sounds are normal. She exhibits no distension. There is tenderness in the right upper quadrant. There is positive Murphy's sign. There is no rigidity, no rebound, no guarding, no CVA tenderness and no tenderness at McBurney's point.    Soft, obese but nondistended, +BS throughout, with mild RUQ TTP, no r/g/r, +murphy's, neg mcburney's, no CVA TTP   Musculoskeletal: Normal range of motion.  Neurological: She is alert and oriented to person, place, and time. She has normal strength. No sensory deficit.  Skin: Skin is warm, dry and intact. No rash noted.  Psychiatric: She has a normal mood and affect.  Nursing note and vitals reviewed.    ED Treatments / Results  Labs (all labs ordered are listed, but only abnormal results are displayed) Labs Reviewed  CBC - Abnormal; Notable for the following:        Result Value   WBC 10.8 (*)    RBC 5.70 (*)    MCV 75.4 (*)    MCH 23.7 (*)    Platelets 460 (*)    All other components within normal limits  LIPASE, BLOOD  COMPREHENSIVE METABOLIC PANEL  URINALYSIS, ROUTINE W REFLEX MICROSCOPIC  POC URINE PREG, ED    EKG  EKG Interpretation None       Radiology [place] Abdomen Limited  Result Date: 04/26/2017 CLINICAL DATA:  Right upper quadrant pain. Right upper quadrant tenderness. Positive Murphy's. EXAM: ULTRASOUND ABDOMEN LIMITED RIGHT UPPER QUADRANT COMPARISON:  None. FINDINGS: Gallbladder: Physiologically distended. No gallstones or wall thickening visualized. No pericholecystic fluid. No sonographic Murphy sign noted by sonographer. Common bile duct: Diameter: 5 mm. Liver: No focal lesion identified. Diffusely increased and heterogeneous in parenchymal echogenicity. Normal directional flow in the main portal vein. IMPRESSION: 1. No gallstones or pericholecystic inflammation. No biliary dilatation. 2. Hepatic steatosis. Electronically Signed   By: Rubye OaksMelanie  Ehinger M.D.   On: 04/26/2017 01:43    Procedures Procedures (including critical care time)  Medications Ordered in ED Medications  ondansetron (ZOFRAN-ODT) disintegrating tablet 4 mg (4 mg Oral Given 04/25/17 2106)  ketorolac (TORADOL) 30 MG/ML injection 30 mg (30 mg Intramuscular Given 04/26/17 0122)     Initial  Impression / Assessment and Plan / ED Course  I have reviewed the triage vital signs and the nursing notes.  Pertinent labs & imaging results that were available during my care of the patient were reviewed by me and considered in my medical decision making (see chart for details).     26 y.o. female here with generalized migratory abd pain, n/v/d x1 day. On exam, mild RUQ TTP, +murphy's, nonperitoneal, no lower abd TTP. Upreg neg, U/A neg, Lipase WNL, CMP WNL, CBC with mildly elevated WBC 10.8 but otherwise WNL. Will give fluids, toradol, zofran given in triage and helped, and  will get RUQ U/S to r/o gallbladder pathology. Will reassess shortly  4:27 AM RUQ U/S without biliary tree findings, hepatic steatosis noted but otherwise negative. Pt feeling much better and tolerating PO well. Likely viral gastroenteritis. Advised BRAT diet, OTC remedies to help with symptomatic relief. Rx zofran given. Discussed f/up with PCP in 1wk for recheck of symptoms and ongoing management of symptoms. I explained the diagnosis and have given explicit precautions to return to the ER including for any other new or worsening symptoms. The patient understands and accepts the medical plan as it's been dictated and I have answered their questions. Discharge instructions concerning home care and prescriptions have been given. The patient is STABLE and is discharged to home in good condition.    Final Clinical Impressions(s) / ED Diagnoses   Final diagnoses:  Acute abdominal pain  Colicky RUQ abdominal pain  Nausea vomiting and diarrhea  Viral gastroenteritis  Hepatic steatosis    New Prescriptions New Prescriptions   ONDANSETRON (ZOFRAN ODT) 4 MG DISINTEGRATING TABLET    Take 1 tablet (4 mg total) by mouth every 8 (eight) hours as needed for nausea or vomiting.     [address] [place], [place] 04/26/17 2130    Nicanor Alcon, April, MD 04/26/17 [phone]

## 2017-04-26 NOTE — ED Notes (Signed)
US in room 

## 2017-04-26 NOTE — Discharge Instructions (Signed)
Use zofran as prescribed, as needed for nausea. Alternate between tylenol and motrin as needed for pain. Stay well hydrated with small sips of fluids throughout the day. Follow a BRAT (banana-rice-applesauce-toast) diet as described below for the next 24-48 hours. The 'BRAT' diet is suggested, then progress to diet as tolerated as symptoms abate. Call your regular doctor if bloody stools, persistent diarrhea, vomiting, fever or abdominal pain. Follow up with your regular doctor in 1 week for recheck of symptoms. Return to ER for changing or worsening of symptoms.

## 2020-05-30 ENCOUNTER — Emergency Department (HOSPITAL_COMMUNITY): Payer: 59

## 2020-05-30 ENCOUNTER — Encounter (HOSPITAL_COMMUNITY): Payer: Self-pay | Admitting: Emergency Medicine

## 2020-05-30 ENCOUNTER — Emergency Department (HOSPITAL_COMMUNITY)
Admission: EM | Admit: 2020-05-30 | Discharge: 2020-05-30 | Disposition: A | Discharge status: Home / Self Care | Payer: 59 | Attending: Emergency Medicine | Admitting: Emergency Medicine

## 2020-05-30 ENCOUNTER — Other Ambulatory Visit: Payer: Self-pay

## 2020-05-30 DIAGNOSIS — U071 COVID-19: Secondary | ICD-10-CM | POA: Insufficient documentation

## 2020-05-30 DIAGNOSIS — R059 Cough, unspecified: Secondary | ICD-10-CM

## 2020-05-30 DIAGNOSIS — Z79899 Other long term (current) drug therapy: Secondary | ICD-10-CM | POA: Diagnosis not present

## 2020-05-30 DIAGNOSIS — R05 Cough: Secondary | ICD-10-CM | POA: Diagnosis present

## 2020-05-30 MED ORDER — ALBUTEROL SULFATE HFA 108 (90 BASE) MCG/ACT IN AERS
2.0000 | INHALATION_SPRAY | Freq: Once | RESPIRATORY_TRACT | Status: AC
  Administered 2020-05-30: 15:00:00 2 via RESPIRATORY_TRACT
  Filled 2020-05-30: qty 6.7

## 2020-05-30 MED ORDER — AEROCHAMBER PLUS FLO-VU MEDIUM MISC
1.0000 | Freq: Once | Status: AC
  Administered 2020-05-30: 15:00:00 1
  Filled 2020-05-30: qty 1

## 2020-05-30 MED ORDER — BENZONATATE 100 MG PO CAPS: 100.0000 mg | ORAL_CAPSULE | Freq: Three times a day (TID) | ORAL | 0 refills | Status: DC

## 2020-05-30 NOTE — ED Provider Notes (Signed)
[facility]-EMERGENCY DEPT Provider Note   CSN: [id number] Arrival date & time: 05/30/20  1030     History Chief Complaint  Patient presents with  . covid positive    Debra [name] is a 29 y.o. female without significant past medical history who presents to the emergency department with complaints of cough in the setting of COVID-19.  Patient states she tested positive and became symptomatic 1 week prior.  She states that she has been having a dry cough that is quite bothersome, she does have some chest pain with coughing, otherwise no chest discomfort.  No other alleviating or aggravating factors.  She has not been significantly short of breath.  She denies fever, chills, leg pain/swelling, hemoptysis, recent surgery/trauma, recent long travel, personal hx of cancer, or hx of DVT/PE.  She does get Depo-Provera injections.     HPI     History reviewed. No pertinent past medical history.  There are no problems to display for this patient.   History reviewed. No pertinent surgical history.   OB History   No obstetric history on file.     No family history on file.  Social History   Tobacco Use  . Smoking status: Never Smoker  . Smokeless tobacco: Never Used  Substance Use Topics  . Alcohol use: No  . Drug use: No    Home Medications Prior to Admission medications   Medication Sig Start Date End Date Taking? Authorizing Provider  amoxicillin (AMOXIL) 500 MG capsule Take 1 capsule (500 mg total) by mouth 3 (three) times daily. Patient not taking: Reported on 10/05/2015 05/26/15   Marlon Pel, PA-C  ibuprofen (ADVIL,MOTRIN) 600 MG tablet Take 1 tablet (600 mg total) by mouth every 6 (six) hours as needed. Patient not taking: Reported on 04/26/2017 08/09/16   Jaynie Crumble, PA-C  ibuprofen (ADVIL,MOTRIN) 800 MG tablet Take 1 tablet (800 mg total) by mouth 3 (three) times daily. Patient not taking: Reported on 04/26/2017 11/24/15   Barrett Henle, PA-C  methocarbamol (ROBAXIN) 500 MG tablet Take 1 tablet (500 mg total) by mouth 2 (two) times daily. Patient not taking: Reported on 04/26/2017 10/05/15   Palumbo, April, MD  naproxen (NAPROSYN) 500 MG tablet Take 1 tablet (500 mg total) by mouth 2 (two) times daily. Patient not taking: Reported on 04/26/2017 10/05/15   Palumbo, April, MD  ondansetron (ZOFRAN ODT) 4 MG disintegrating tablet Take 1 tablet (4 mg total) by mouth every 8 (eight) hours as needed for nausea or vomiting. 04/26/17   [address], [place], [place]    Allergies    Patient has no known allergies.  Review of Systems   Review of Systems  Constitutional: Negative for chills and fever.  Respiratory: Positive for cough. Negative for shortness of breath.   Cardiovascular: Negative for leg swelling (w/ coughing otherwise none).  Gastrointestinal: Negative for abdominal pain, diarrhea, nausea and vomiting.  Genitourinary: Negative for dysuria.  Neurological: Negative for syncope.  All other systems reviewed and are negative.   Physical Exam Updated Vital Signs BP (!) 140/99 (BP Location: Left Arm)   Pulse 100   Temp 99.1 F (37.3 C) (Oral)   Resp (!) 26   SpO2 93%   Physical Exam Vitals and nursing note reviewed.  Constitutional:      General: She is not in acute distress.    Appearance: She is well-developed. She is not toxic-appearing.  HENT:     Head: Normocephalic and atraumatic.  Eyes:  General:        Right eye: No discharge.        Left eye: No discharge.     Conjunctiva/sclera: Conjunctivae normal.  Cardiovascular:     Rate and Rhythm: Normal rate and regular rhythm.  Pulmonary:     Effort: No respiratory distress.     Breath sounds: Wheezing (minimal expiratory throughout) present. No rhonchi or rales.  Abdominal:     General: There is no distension.     Palpations: Abdomen is soft.     Tenderness: There is no abdominal tenderness. There is no guarding or rebound.  Musculoskeletal:       Cervical back: Neck supple.     Right lower leg: No edema.     Left lower leg: No edema.  Skin:    General: Skin is warm and dry.     Findings: No rash.  Neurological:     Mental Status: She is alert.     Comments: Clear speech.   Psychiatric:        Behavior: Behavior normal.    ED Results / Procedures / Treatments   Labs (all labs ordered are listed, but only abnormal results are displayed) Labs Reviewed - No data to display  EKG None  Radiology DG Chest 2 View  Result Date: 05/30/2020 CLINICAL DATA:  Chest pain, body aches, cough, symptoms for 6 days, COVID-19 positive EXAM: CHEST - 2 VIEW COMPARISON:  None FINDINGS: Upper normal heart size. Mediastinal contours and pulmonary vascularity normal. Slightly decreased lung volumes with patchy BILATERAL pulmonary infiltrates consistent with multifocal pneumonia and history of COVID-19. No pleural effusion or pneumothorax. Osseous structures unremarkable. IMPRESSION: Patchy BILATERAL pulmonary infiltrates consistent with multifocal pneumonia and COVID-19. Electronically Signed   By: Ulyses Southward M.D.   On: 05/30/2020 13:22    Procedures Procedures (including critical care time)  Medications Ordered in ED Medications - No data to display  ED Course  I have reviewed the triage vital signs and the nursing notes.  Pertinent labs & imaging results that were available during my care of the patient were reviewed by me and considered in my medical decision making (see chart for details).    Tiea Trampe was evaluated in Emergency Department on 05/30/2020 for the symptoms described in the history of present illness. He/she was evaluated in the context of the global COVID-19 pandemic, which necessitated consideration that the patient might be at risk for infection with the SARS-CoV-2 virus that causes COVID-19. Institutional protocols and algorithms that pertain to the evaluation of patients at risk for COVID-19 are in a state of rapid  change based on information released by regulatory bodies including the CDC and federal and state organizations. These policies and algorithms were followed during the patient's care in the ED.  MDM Rules/Calculators/A&P                         Patient with known COVID-19 symptoms for a week presents to the emergency department with complaints of cough.  She is nontoxic, resting comfortably, vitals noted for elevated blood pressure, nursing staff has documented tachypnea at 26, however patient's respiratory rate on my assessment is within normal limits. She is able to ambulate and maintain SPO2 @ > 94% on RA. Mild expiratory wheeze treated with albuterol inhaler which patient is to go home with. Low risk wells low suspicion for PE. Chest pain only with coughing, EKG without significant ischemic changes.  Chest x-ray ordered per  triage, I personally reviewed and interpreted imaging, findings consistent with Covid pneumonia,m, no acute worsening or change to raise concern for superimposed bacterial pneumonia.  Will discharge home with antitussive medication, patient information sent to the infusion clinic. I discussed results, treatment plan, need for follow-up, and return precautions with the patient. Provided opportunity for questions, patient confirmed understanding and is in agreement with plan.   Final Clinical Impression(s) / ED Diagnoses Final diagnoses:  Cough  COVID-19    Rx / DC Orders ED Discharge Orders         Ordered    benzonatate (TESSALON) 100 MG capsule  Every 8 hours        05/30/20 1503           Jett Kulzer, [place], PA-C 05/30/20 1534    Wynetta Fines, MD 05/30/20 2250

## 2020-05-30 NOTE — ED Triage Notes (Signed)
Per pt, states she tested positive for covid last Saturday-states she feels like she is dehydrated-still having cough, fatigue

## 2020-05-30 NOTE — Discharge Instructions (Addendum)
You were seen in the emergency department today for a cough.  Your chest x-ray looked consistent with Covid.  We have given you an albuterol inhaler in the emergency department, please use 1 to 2 puffs every 4-6 hours as needed for shortness of breath, wheezing, or persistent coughing.  We are also sending you home with Tessalon to take every 8 hours as needed for coughing.   We have prescribed you new medication(s) today. Discuss the medications prescribed today with your pharmacist as they can have adverse effects and interactions with your other medicines including over the counter and prescribed medications. Seek medical evaluation if you start to experience new or abnormal symptoms after taking one of these medicines, seek care immediately if you start to experience difficulty breathing, feeling of your throat closing, facial swelling, or rash as these could be indications of a more serious allergic reaction  We have provided your information to the infusion center, they may call you to discuss the possibility of this.  Call your primary care provider within 1 week.  Return to the ER for new or worsening symptoms including but not limited to trouble breathing, passing out, coughing up blood, chest pain, or any other concerns.

## 2020-05-31 ENCOUNTER — Encounter (HOSPITAL_COMMUNITY): Payer: Self-pay | Admitting: Physician Assistant

## 2020-05-31 ENCOUNTER — Telehealth (HOSPITAL_COMMUNITY): Payer: Self-pay | Admitting: Physician Assistant

## 2020-05-31 NOTE — Telephone Encounter (Signed)
Called to discuss with patient about Covid symptoms and the use of casirivimab/imdevimab, a monoclonal antibody infusion for those with mild to moderate Covid symptoms and at a high risk of hospitalization.  Pt is qualified for this infusion at the [facility] due to; Specific high risk criteria : BMI > 25   No answer and VM not set up. No mychart.   Cline Crock PA-C  [facility]

## 2021-05-07 IMAGING — CR DG CHEST 2V
2 series · 2 of 2 positions shown · non-contrast
Comparison: None

CLINICAL DATA: Chest pain, body aches, cough, symptoms for 6 days,
5743Z-V0 positive

EXAM:
CHEST - 2 VIEW

[w chest pa]
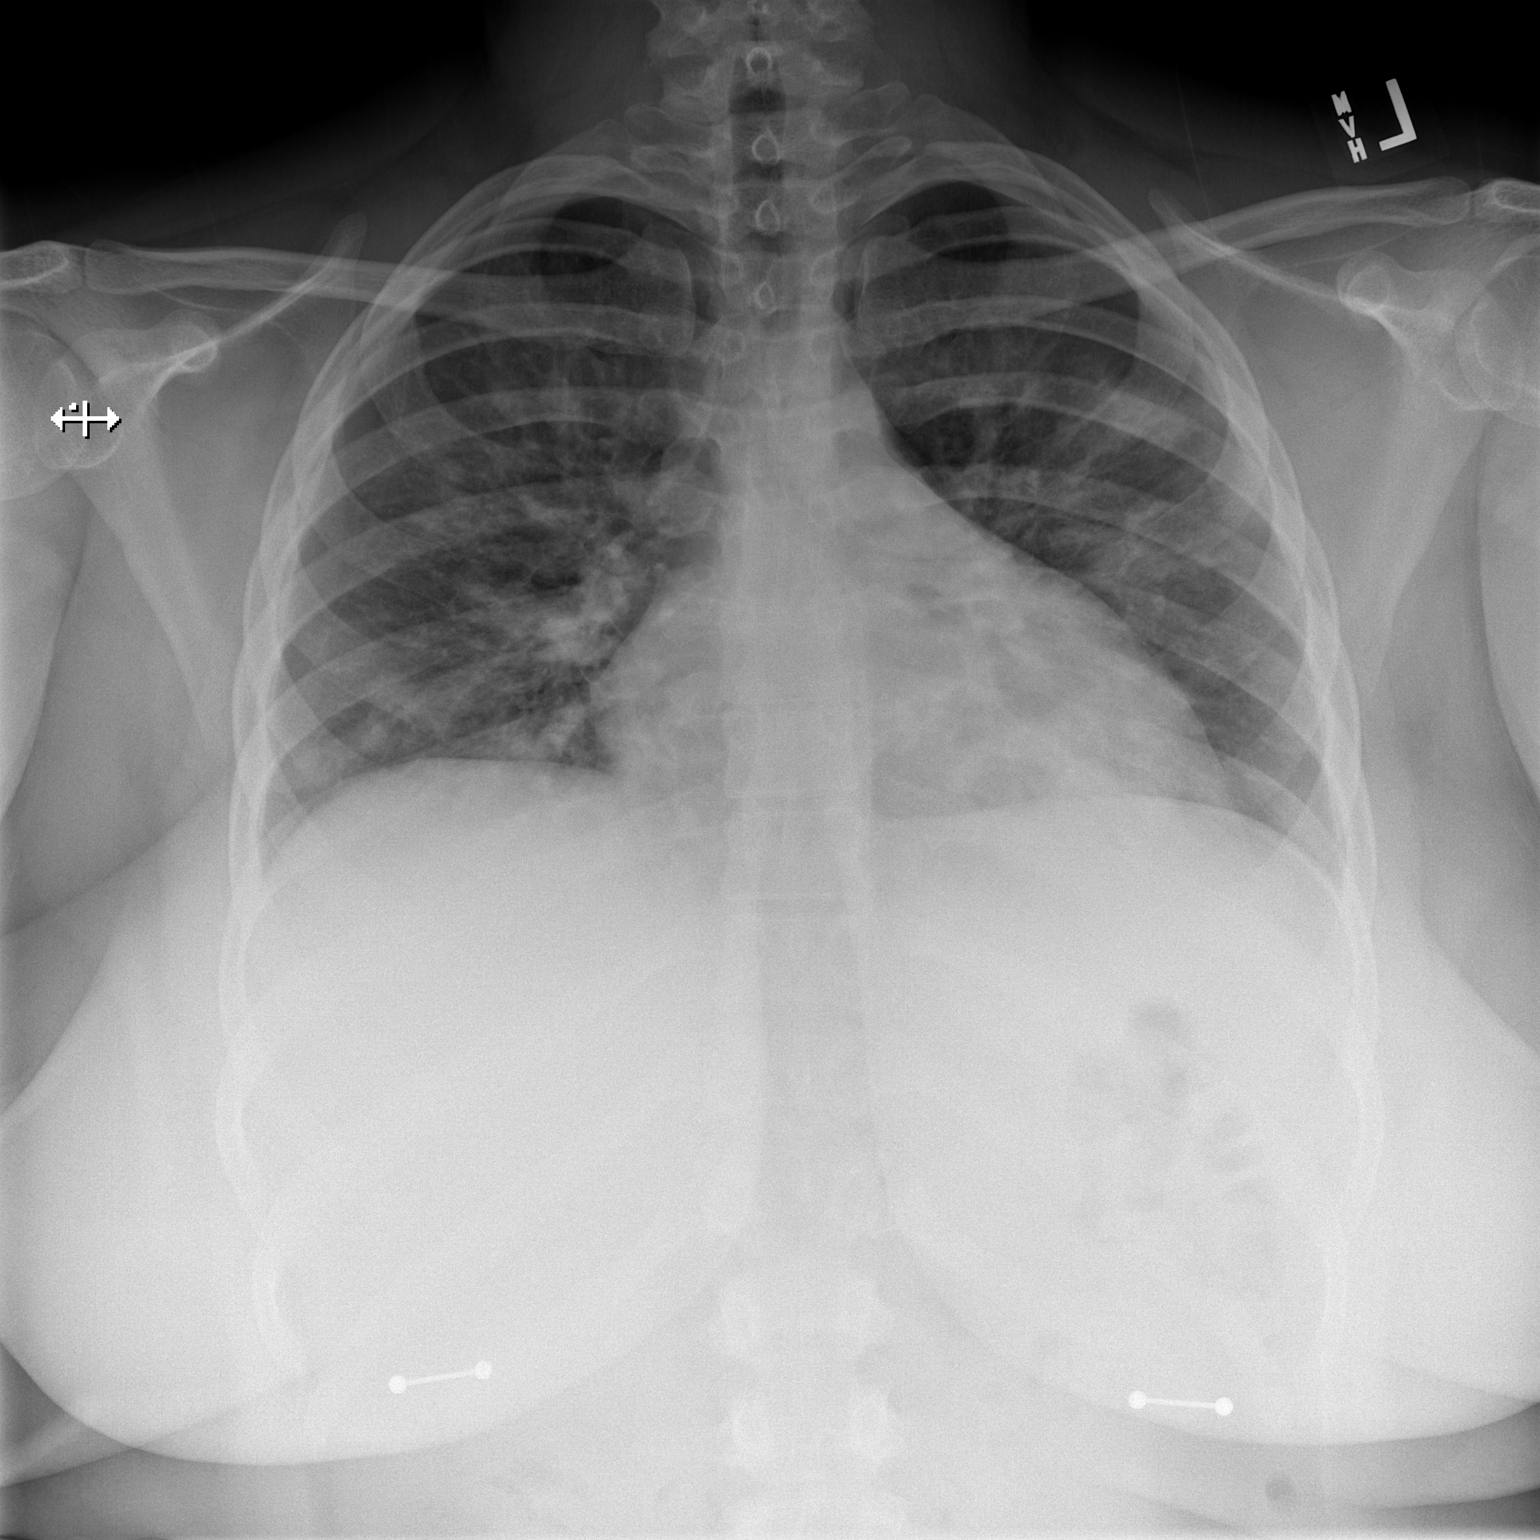

[w chest lat]
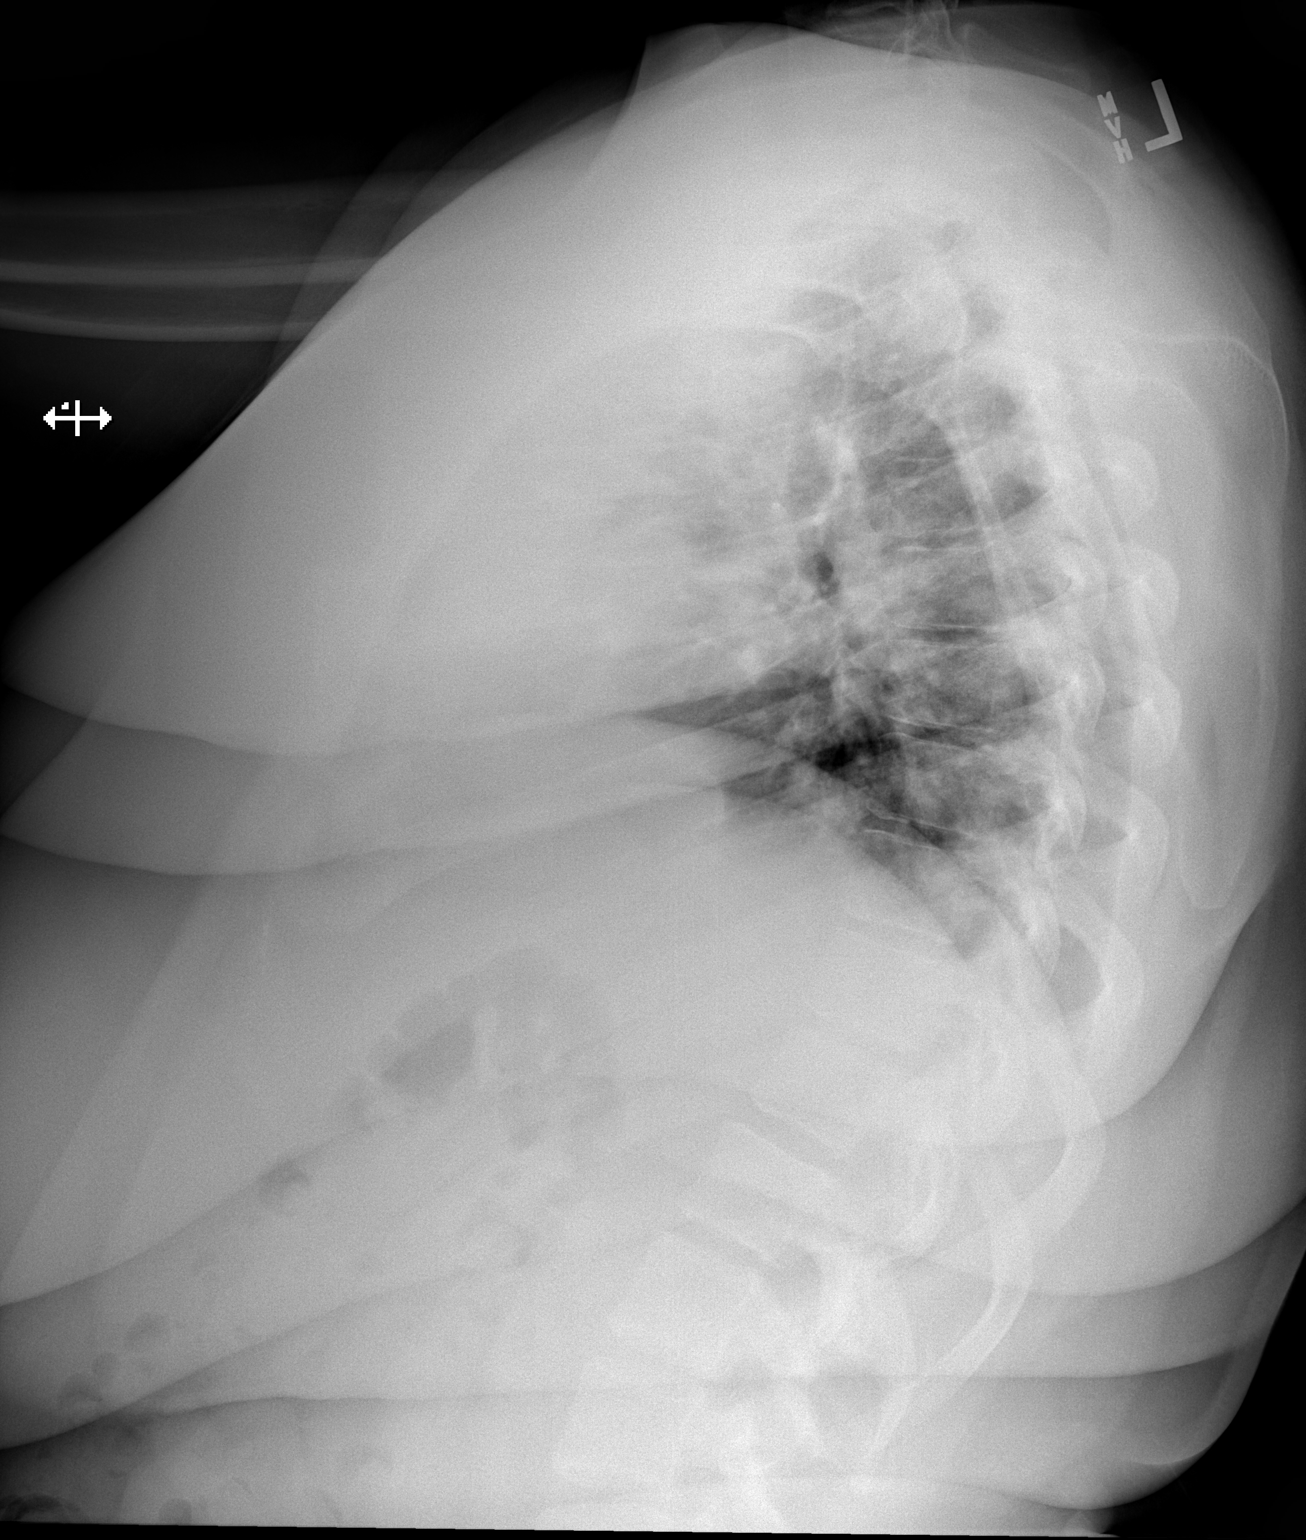

[2 of 2 positions shown; findings below may reference images not displayed]

FINDINGS: Upper normal heart size.

Mediastinal contours and pulmonary vascularity normal.

Slightly decreased lung volumes with patchy BILATERAL pulmonary
infiltrates consistent with multifocal pneumonia and history of
5743Z-V0.

No pleural effusion or pneumothorax.

Osseous structures unremarkable.
IMPRESSION: Patchy BILATERAL pulmonary infiltrates consistent with multifocal
pneumonia and 5743Z-V0.

## 2021-08-12 ENCOUNTER — Encounter: Payer: Self-pay | Admitting: Emergency Medicine

## 2021-08-12 ENCOUNTER — Other Ambulatory Visit: Payer: Self-pay

## 2021-08-12 ENCOUNTER — Ambulatory Visit
Admission: EM | Admit: 2021-08-12 | Discharge: 2021-08-12 | Disposition: A | Discharge status: Home / Self Care | Payer: 59 | Attending: Internal Medicine | Admitting: Internal Medicine

## 2021-08-12 DIAGNOSIS — Z20822 Contact with and (suspected) exposure to covid-19: Secondary | ICD-10-CM

## 2021-08-12 DIAGNOSIS — H65193 Other acute nonsuppurative otitis media, bilateral: Secondary | ICD-10-CM

## 2021-08-12 DIAGNOSIS — J069 Acute upper respiratory infection, unspecified: Secondary | ICD-10-CM | POA: Diagnosis not present

## 2021-08-12 MED ORDER — FLUTICASONE PROPIONATE 50 MCG/ACT NA SUSP: 1.0000 | Freq: Every day | NASAL | 0 refills | Status: DC

## 2021-08-12 MED ORDER — CETIRIZINE HCL 10 MG PO TABS: 10.0000 mg | ORAL_TABLET | Freq: Every day | ORAL | 0 refills | Status: DC

## 2021-08-12 MED ORDER — BENZONATATE 100 MG PO CAPS: 100.0000 mg | ORAL_CAPSULE | Freq: Three times a day (TID) | ORAL | 0 refills | Status: DC | PRN

## 2021-08-12 NOTE — Discharge Instructions (Signed)
You likely having a viral upper respiratory infection. We recommended symptom control. I expect your symptoms to start improving in the next 1-2 weeks.   1. Take a daily allergy pill/anti-histamine like Zyrtec, Claritin, or Store brand consistently for 2 weeks. You have been prescribed these medications.   2. For congestion you may try an oral decongestant like Mucinex or sudafed. You may also try intranasal flonase nasal spray or saline irrigations (neti pot, sinus cleanse). You have been prescribed flonase.   3. For your sore throat you may try cepacol lozenges, salt water gargles, throat spray. Treatment of congestion may also help your sore throat.  4. For cough you may try the medication that was prescribed.   5. Take Tylenol or Ibuprofen to help with pain/inflammation  6. Stay hydrated, drink plenty of fluids to keep throat coated and less irritated  Honey Tea For cough/sore throat try using a honey-based tea. Use 3 teaspoons of honey with juice squeezed from half lemon. Place shaved pieces of ginger into 1/2-1 cup of water and warm over stove top. Then mix the ingredients and repeat every 4 hours as needed.   Your COVID-19, flu, RSV test is pending.  We will call if it is positive.  You have been prescribed cetirizine, Flonase, benzonatate to take for your symptoms.

## 2021-08-12 NOTE — ED Triage Notes (Signed)
Left ear pain with cough starting yesterday. Denies sore throat, nasal congestion. Works as a Runner, broadcasting/film/video

## 2021-08-12 NOTE — ED Provider Notes (Signed)
[facility]    CSN: [id number] Arrival date & time: 08/12/21  1013      History   Chief Complaint Chief Complaint  Patient presents with   Cough    HPI Debra [name] is a 30 y.o. female.   Patient presents with left ear pain and a nonproductive cough that started yesterday.  Denies sore throat, nasal congestion, runny nose, nausea, vomiting, diarrhea, abdominal pain, fever.  Denies any known sick contacts but does work as a Runner, broadcasting/film/video.  Denies chest pain or shortness of breath.  Patient has not yet taken any medications to alleviate symptoms.   Cough  Past Medical History:  Diagnosis Date   Morbid obesity [facility])     Patient Active Problem List   Diagnosis Date Noted   Morbid obesity (HCC)     History reviewed. No pertinent surgical history.  OB History   No obstetric history on file.      Home Medications    Prior to Admission medications   Medication Sig Start Date End Date Taking? Authorizing Provider  benzonatate (TESSALON) 100 MG capsule Take 1 capsule (100 mg total) by mouth every 8 (eight) hours as needed for cough. 08/12/21  Yes Namita Yearwood, Acie Fredrickson, FNP  cetirizine (ZYRTEC) 10 MG tablet Take 1 tablet (10 mg total) by mouth daily for 10 days. 08/12/21 08/22/21 Yes Merci Walthers, Acie Fredrickson, FNP  fluticasone (FLONASE) 50 MCG/ACT nasal spray Place 1 spray into both nostrils daily for 3 days. 08/12/21 08/15/21 Yes [place] Greeson, Acie Fredrickson, FNP  ondansetron (ZOFRAN ODT) 4 MG disintegrating tablet Take 1 tablet (4 mg total) by mouth every 8 (eight) hours as needed for nausea or vomiting. 04/26/17   [address], [place], [place]    Family History History reviewed. No pertinent family history.  Social History Social History   Tobacco Use   Smoking status: Never   Smokeless tobacco: Never  Substance Use Topics   Alcohol use: No   Drug use: No     Allergies   Penicillins   Review of Systems Review of Systems Per HPI  Physical Exam Triage Vital Signs ED Triage Vitals   Enc Vitals Group     BP --      Pulse Rate 08/12/21 1151 85     Resp 08/12/21 1151 16     Temp 08/12/21 1151 98.2 F (36.8 C)     Temp Source 08/12/21 1151 Oral     SpO2 08/12/21 1151 96 %     Weight --      Height --      Head Circumference --      Peak Flow --      Pain Score 08/12/21 1152 5     Pain Loc --      Pain Edu? --      Excl. in GC? --    No data found.  Updated Vital Signs Pulse 85   Temp 98.2 F (36.8 C) (Oral)   Resp 16   SpO2 96%   Visual Acuity Right Eye Distance:   Left Eye Distance:   Bilateral Distance:    Right Eye Near:   Left Eye Near:    Bilateral Near:     Physical Exam Constitutional:      General: She is not in acute distress.    Appearance: Normal appearance. She is not toxic-appearing or diaphoretic.  HENT:     Head: Normocephalic and atraumatic.     Right Ear: Ear canal normal. No drainage, swelling or tenderness.  A middle ear effusion is present. No mastoid tenderness. Tympanic membrane is not perforated, erythematous or bulging.     Left Ear: Ear canal normal. No drainage, swelling or tenderness. A middle ear effusion is present. No mastoid tenderness. Tympanic membrane is not perforated, erythematous or bulging.     Nose: Congestion present.     Mouth/Throat:     Mouth: Mucous membranes are moist.     Pharynx: No posterior oropharyngeal erythema.  Eyes:     Extraocular Movements: Extraocular movements intact.     Conjunctiva/sclera: Conjunctivae normal.     Pupils: Pupils are equal, round, and reactive to light.  Cardiovascular:     Rate and Rhythm: Normal rate and regular rhythm.     Pulses: Normal pulses.     Heart sounds: Normal heart sounds.  Pulmonary:     Effort: Pulmonary effort is normal. No respiratory distress.     Breath sounds: Normal breath sounds. No wheezing.  Abdominal:     General: Abdomen is flat. Bowel sounds are normal.     Palpations: Abdomen is soft.  Musculoskeletal:        General: Normal range of  motion.     Cervical back: Normal range of motion.  Skin:    General: Skin is warm and dry.  Neurological:     General: No focal deficit present.     Mental Status: She is alert and oriented to person, place, and time. Mental status is at baseline.  Psychiatric:        Mood and Affect: Mood normal.        Behavior: Behavior normal.     UC Treatments / Results  Labs (all labs ordered are listed, but only abnormal results are displayed) Labs Reviewed  COVID-19, FLU A+B AND RSV    EKG   Radiology No results found.  Procedures Procedures (including critical care time)  Medications Ordered in UC Medications - No data to display  Initial Impression / Assessment and Plan / UC Course  I have reviewed the triage vital signs and the nursing notes.  Pertinent labs & imaging results that were available during my care of the patient were reviewed by me and considered in my medical decision making (see chart for details).     Patient presents with symptoms likely from a viral upper respiratory infection. Differential includes bacterial pneumonia, sinusitis, allergic rhinitis, Covid 19. Do not suspect underlying cardiopulmonary process. Symptoms seem unlikely related to ACS, CHF or COPD exacerbations, pneumonia, pneumothorax. Patient is nontoxic appearing and not in need of emergent medical intervention.  COVID-19, flu, RSV test pending.  Do not think that strep testing is necessary given that there is no erythema or abnormalities to posterior pharynx or tonsils on exam.  Recommended symptom control with over the counter medications: Daily oral anti-histamine, Oral decongestant or IN corticosteroid, saline irrigations, cepacol lozenges, Robitussin, Delsym, honey tea.  Cetirizine, Flonase, benzonatate prescribed for patient.  Return if symptoms fail to improve in 1-2 weeks or you develop shortness of breath, chest pain, severe headache. Patient states understanding and is  agreeable.  Discharged with PCP followup.  Final Clinical Impressions(s) / UC Diagnoses   Final diagnoses:  Viral upper respiratory tract infection with cough  Acute MEE (middle ear effusion), bilateral  Encounter for laboratory testing for COVID-19 virus     Discharge Instructions      You likely having a viral upper respiratory infection. We recommended symptom control. I expect your symptoms to start improving in  the next 1-2 weeks.   1. Take a daily allergy pill/anti-histamine like Zyrtec, Claritin, or Store brand consistently for 2 weeks. You have been prescribed these medications.   2. For congestion you may try an oral decongestant like Mucinex or sudafed. You may also try intranasal flonase nasal spray or saline irrigations (neti pot, sinus cleanse). You have been prescribed flonase.   3. For your sore throat you may try cepacol lozenges, salt water gargles, throat spray. Treatment of congestion may also help your sore throat.  4. For cough you may try the medication that was prescribed.   5. Take Tylenol or Ibuprofen to help with pain/inflammation  6. Stay hydrated, drink plenty of fluids to keep throat coated and less irritated  Honey Tea For cough/sore throat try using a honey-based tea. Use 3 teaspoons of honey with juice squeezed from half lemon. Place shaved pieces of ginger into 1/2-1 cup of water and warm over stove top. Then mix the ingredients and repeat every 4 hours as needed.   Your COVID-19, flu, RSV test is pending.  We will call if it is positive.  You have been prescribed cetirizine, Flonase, benzonatate to take for your symptoms.     ED Prescriptions     Medication Sig Dispense Auth. Provider   cetirizine (ZYRTEC) 10 MG tablet Take 1 tablet (10 mg total) by mouth daily for 10 days. 30 tablet [place], [place], [place]   fluticasone [facility]) 50 MCG/ACT nasal spray Place 1 spray into both nostrils daily for 3 days. 16 g Johne Buckle, Rolly [place]ter [place], [place]   benzonatate  (TESSALON) 100 MG capsule Take 1 capsule (100 mg total) by mouth every 8 (eight) hours as needed for cough. 21 capsule [place], Acie Fredrickson, [place]      PDMP not reviewed this encounter.   Gustavus Bryant, [place] 08/12/21 1250

## 2021-08-13 LAB — COVID-19, FLU A+B AND RSV
Influenza A, NAA: NOT DETECTED
Influenza B, NAA: NOT DETECTED
RSV, NAA: NOT DETECTED
SARS-CoV-2, NAA: NOT DETECTED

## 2022-03-26 ENCOUNTER — Other Ambulatory Visit (HOSPITAL_COMMUNITY)
Admission: RE | Admit: 2022-03-26 | Discharge: 2022-03-26 | Disposition: A | Discharge status: Home / Self Care | Payer: 59 | Source: Ambulatory Visit | Attending: Medical | Admitting: Medical

## 2022-03-26 ENCOUNTER — Ambulatory Visit (INDEPENDENT_AMBULATORY_CARE_PROVIDER_SITE_OTHER): Payer: 59 | Admitting: Medical

## 2022-03-26 ENCOUNTER — Encounter (HOSPITAL_BASED_OUTPATIENT_CLINIC_OR_DEPARTMENT_OTHER): Payer: Self-pay | Admitting: Medical

## 2022-03-26 VITALS — BP 162/103 | HR 86 | Ht 65.0 in | Wt 314.6 lb

## 2022-03-26 DIAGNOSIS — Z01419 Encounter for gynecological examination (general) (routine) without abnormal findings: Secondary | ICD-10-CM | POA: Insufficient documentation

## 2022-03-26 DIAGNOSIS — E282 Polycystic ovarian syndrome: Secondary | ICD-10-CM | POA: Diagnosis not present

## 2022-03-26 DIAGNOSIS — R030 Elevated blood-pressure reading, without diagnosis of hypertension: Secondary | ICD-10-CM | POA: Diagnosis not present

## 2022-03-26 MED ORDER — MEDROXYPROGESTERONE ACETATE 10 MG PO TABS: 10.0000 mg | ORAL_TABLET | Freq: Every day | ORAL | 11 refills | Status: DC

## 2022-03-30 LAB — CYTOLOGY - PAP
Chlamydia: NEGATIVE
Comment: NEGATIVE
Comment: NORMAL
Diagnosis: NEGATIVE
Neisseria Gonorrhea: NEGATIVE

## 2022-03-31 ENCOUNTER — Ambulatory Visit (INDEPENDENT_AMBULATORY_CARE_PROVIDER_SITE_OTHER): Payer: 59 | Admitting: Nurse Practitioner

## 2022-03-31 ENCOUNTER — Encounter: Payer: Self-pay | Admitting: Nurse Practitioner

## 2022-03-31 VITALS — BP 132/84 | HR 83 | Temp 98.1°F | Ht 64.96 in | Wt 313.0 lb

## 2022-03-31 DIAGNOSIS — E559 Vitamin D deficiency, unspecified: Secondary | ICD-10-CM

## 2022-03-31 DIAGNOSIS — Z7689 Persons encountering health services in other specified circumstances: Secondary | ICD-10-CM

## 2022-03-31 DIAGNOSIS — Z6841 Body Mass Index (BMI) 40.0 and over, adult: Secondary | ICD-10-CM | POA: Insufficient documentation

## 2022-03-31 DIAGNOSIS — R030 Elevated blood-pressure reading, without diagnosis of hypertension: Secondary | ICD-10-CM

## 2022-03-31 DIAGNOSIS — Z0000 Encounter for general adult medical examination without abnormal findings: Secondary | ICD-10-CM

## 2022-03-31 DIAGNOSIS — I152 Hypertension secondary to endocrine disorders: Secondary | ICD-10-CM | POA: Insufficient documentation

## 2022-03-31 NOTE — Progress Notes (Signed)
New Patient Office Visit  Subjective    Patient ID: Debra [name], female    DOB: 1991/01/22  Age: 31 y.o. MRN: [id number]  CC:  Chief Complaint  Patient presents with   New Patient (Initial Visit)    HPI  Debra [name] presents to establish care Has not had PCP in the past.  -sees OB/GYN.  -told she had hypertension per GYN and was told she needed to be followed per PCP.  -denies chest pain, chest pressure, unusual headaches, or dizziness.  -has stopped drinking caffeinated beverages. Has cut back salt intake since she was seen by GYN last week.  -she is due to have routine fasting labs done.   Outpatient Encounter Medications as of 03/31/2022  Medication Sig   medroxyPROGESTERone (PROVERA) 10 MG tablet Take 1 tablet (10 mg total) by mouth daily.   [DISCONTINUED] benzonatate (TESSALON) 100 MG capsule Take 1 capsule (100 mg total) by mouth every 8 (eight) hours as needed for cough. (Patient not taking: Reported on 03/26/2022)   [DISCONTINUED] cetirizine (ZYRTEC) 10 MG tablet Take 1 tablet (10 mg total) by mouth daily for 10 days.   [DISCONTINUED] fluticasone (FLONASE) 50 MCG/ACT nasal spray Place 1 spray into both nostrils daily for 3 days.   [DISCONTINUED] ondansetron (ZOFRAN ODT) 4 MG disintegrating tablet Take 1 tablet (4 mg total) by mouth every 8 (eight) hours as needed for nausea or vomiting. (Patient not taking: Reported on 03/26/2022)   No facility-administered encounter medications on file as of 03/31/2022.    Past Medical History:  Diagnosis Date   Morbid obesity (HCC)     History reviewed. No pertinent surgical history.  Family History  Problem Relation Age of Onset   Diabetes Paternal Grandfather    Diabetes Paternal Grandmother    Diabetes Maternal Grandmother    Diabetes Maternal Grandfather     Social History   Socioeconomic History   Marital status: Single    Spouse name: Not on file   Number of children: Not on file   Years of education: Not on  file   Highest education level: Not on file  Occupational History   Not on file  Tobacco Use   Smoking status: Never   Smokeless tobacco: Never  Vaping Use   Vaping Use: Never used  Substance and Sexual Activity   Alcohol use: Yes    Comment: Social   Drug use: No   Sexual activity: Yes    Birth control/protection: None  Other Topics Concern   Not on file  Social History Narrative   Not on file   Social Determinants of Health   Financial Resource Strain: Not on file  Food Insecurity: Not on file  Transportation Needs: Not on file  Physical Activity: Not on file  Stress: Not on file  Social Connections: Not on file  Intimate Partner Violence: Not on file    Review of Systems  Constitutional:  Negative for chills, fever and malaise/fatigue.  HENT:  Negative for congestion, sinus pain and sore throat.   Eyes: Negative.   Respiratory:  Negative for cough, shortness of breath and wheezing.   Cardiovascular:  Negative for chest pain, palpitations and leg swelling.       High blood pressure reading last week when she had GYN visit.   Gastrointestinal:  Negative for constipation, diarrhea, nausea and vomiting.  Genitourinary: Negative.   Musculoskeletal:  Negative for myalgias.  Skin: Negative.   Neurological:  Negative for dizziness and headaches.  Endo/Heme/Allergies:  Does  not bruise/bleed easily.  Psychiatric/Behavioral:  Negative for depression. The patient is not nervous/anxious.         Objective    Today's Vitals   03/31/22 0921  BP: 132/84  Pulse: 83  Temp: 98.1 F (36.7 C)  SpO2: 100%  Weight: (!) 313 lb (142 kg)  Height: 5' 4.96" (1.65 m)   Body mass index is 52.15 kg/m.   Physical Exam Vitals and nursing note reviewed.  Constitutional:      Appearance: Normal appearance. She is well-developed. She is obese.  HENT:     Head: Normocephalic and atraumatic.  Eyes:     Pupils: Pupils are equal, round, and reactive to light.  Cardiovascular:      Rate and Rhythm: Normal rate and regular rhythm.     Pulses: Normal pulses.     Heart sounds: Normal heart sounds.  Pulmonary:     Effort: Pulmonary effort is normal.     Breath sounds: Normal breath sounds.  Abdominal:     Palpations: Abdomen is soft.  Musculoskeletal:        General: Normal range of motion.     Cervical back: Normal range of motion and neck supple.  Lymphadenopathy:     Cervical: No cervical adenopathy.  Skin:    General: Skin is warm and dry.     Capillary Refill: Capillary refill takes less than 2 seconds.  Neurological:     General: No focal deficit present.     Mental Status: She is alert and oriented to person, place, and time.  Psychiatric:        Mood and Affect: Mood normal.        Behavior: Behavior normal.        Thought Content: Thought content normal.        Judgment: Judgment normal.        Assessment & Plan:  1. Elevated blood pressure reading without diagnosis of hypertension Blood pressure dong better today without any medication. Reviewed DASH diet. Gave her written information to support discussion. She will monitor her blood pressure at home one to two times daily and bring log with her to visit next week.  - TSH + free T4; Future - Hemoglobin A1c; Future - Lipid panel; Future - Comprehensive metabolic panel; Future - CBC; Future - CBC - Comprehensive metabolic panel - Lipid panel - Hemoglobin A1c - TSH + free T4  2. BMI 50.0-59.9, adult (HCC) Discussed lowering calorie intake to 1500 calories per day and incorporating exercise into daily routine to help lose weight.  - TSH + free T4; Future - Hemoglobin A1c; Future - Lipid panel; Future - Lipid panel - Hemoglobin A1c - TSH + free T4  3. Vitamin D deficiency Check vitamin d with labs today. Treat deficiency as indicated.  - VITAMIN D 25 Hydroxy (Vit-D Deficiency, Fractures); Future - VITAMIN D 25 Hydroxy (Vit-D Deficiency, Fractures)  4. Encounter to establish  care Appointment today to establish new primary care provider    5. Healthcare maintenance Routine, fasting labs drawn during today's visit.  - TSH + free T4; Future - Hemoglobin A1c; Future - Lipid panel; Future - Comprehensive metabolic panel; Future - CBC; Future - CBC - Comprehensive metabolic panel - Lipid panel - Hemoglobin A1c - TSH + free T4    Problem List Items Addressed This Visit       Other   Elevated blood pressure reading without diagnosis of hypertension - Primary   Relevant Orders   TSH +  free T4   Hemoglobin A1c   Lipid panel   Comprehensive metabolic panel   CBC   BMI 50.0-59.9, adult (HCC)   Relevant Orders   TSH + free T4   Hemoglobin A1c   Lipid panel   Vitamin D deficiency   Relevant Orders   VITAMIN D 25 Hydroxy (Vit-D Deficiency, Fractures)   Other Visit Diagnoses     Encounter to establish care       Healthcare maintenance       Relevant Orders   TSH + free T4   Hemoglobin A1c   Lipid panel   Comprehensive metabolic panel   CBC       Return in about 1 week (around 04/07/2022) for blood pressure - review labs .   Carlean Jews, NP

## 2022-04-01 LAB — LIPID PANEL
Chol/HDL Ratio: 4.1 ratio (ref 0.0–4.4)
Cholesterol, Total: 223 mg/dL — ABNORMAL HIGH (ref 100–199)
HDL: 55 mg/dL (ref >39)
LDL Chol Calc (NIH): 147 mg/dL — ABNORMAL HIGH (ref 0–99)
Triglycerides: 120 mg/dL (ref 0–149)
VLDL Cholesterol Cal: 21 mg/dL (ref 5–40)

## 2022-04-01 LAB — COMPREHENSIVE METABOLIC PANEL
ALT: 36 IU/L — ABNORMAL HIGH (ref 0–32)
AST: 22 IU/L (ref 0–40)
Albumin/Globulin Ratio: 1.4 (ref 1.2–2.2)
Albumin: 4.5 g/dL (ref 3.9–5.0)
Alkaline Phosphatase: 79 IU/L (ref 44–121)
BUN/Creatinine Ratio: 19 (ref 9–23)
BUN: 12 mg/dL (ref 6–20)
Bilirubin Total: 0.4 mg/dL (ref 0.0–1.2)
CO2: 23 mmol/L (ref 20–29)
Calcium: 9.9 mg/dL (ref 8.7–10.2)
Chloride: 101 mmol/L (ref 96–106)
Creatinine, Ser: 0.62 mg/dL (ref 0.57–1.00)
Globulin, Total: 3.2 g/dL (ref 1.5–4.5)
Glucose: 173 mg/dL — ABNORMAL HIGH (ref 70–99)
Potassium: 4.6 mmol/L (ref 3.5–5.2)
Sodium: 139 mmol/L (ref 134–144)
Total Protein: 7.7 g/dL (ref 6.0–8.5)
eGFR: 124 mL/min/{1.73_m2} (ref >59)

## 2022-04-01 LAB — CBC
Hematocrit: 43.7 % (ref 34.0–46.6)
Hemoglobin: 13.3 g/dL (ref 11.1–15.9)
MCH: 22.9 pg — ABNORMAL LOW (ref 26.6–33.0)
MCHC: 30.4 g/dL — ABNORMAL LOW (ref 31.5–35.7)
MCV: 75 fL — ABNORMAL LOW (ref 79–97)
Platelets: 466 10*3/uL — ABNORMAL HIGH (ref 150–450)
RBC: 5.81 x10E6/uL — ABNORMAL HIGH (ref 3.77–5.28)
RDW: 12.6 % (ref 11.7–15.4)
WBC: 8.6 10*3/uL (ref 3.4–10.8)

## 2022-04-01 LAB — HEMOGLOBIN A1C
Est. average glucose Bld gHb Est-mCnc: 154 mg/dL
Hgb A1c MFr Bld: 7 % — ABNORMAL HIGH (ref 4.8–5.6)

## 2022-04-01 LAB — TSH+FREE T4
Free T4: 1.17 ng/dL (ref 0.82–1.77)
TSH: 3.24 u[IU]/mL (ref 0.450–4.500)

## 2022-04-01 LAB — VITAMIN D 25 HYDROXY (VIT D DEFICIENCY, FRACTURES): Vit D, 25-Hydroxy: 8.6 ng/mL — ABNORMAL LOW (ref 30.0–100.0)

## 2022-04-07 NOTE — Progress Notes (Unsigned)
Established patient visit   Patient: Debra [name]   DOB: 1990/11/27   31 y.o. Female  MRN: [id number] Visit Date: 04/08/2022   No chief complaint on file.  Subjective    HPI  Follow up visit.  -elevated blood pressure.  -routine, fasting labs done at last visit.  --significant low vitamin D  -abnormal CBC,  but essentially unchanged since last check 4 years ago . --glucose 173 with HgbA1c 7.0  --mild elevation of LDL and total cholesterol.    Medications: Outpatient Medications Prior to Visit  Medication Sig   medroxyPROGESTERone (PROVERA) 10 MG tablet Take 1 tablet (10 mg total) by mouth daily.   No facility-administered medications prior to visit.    Review of Systems  {Labs (Optional):23779}   Objective    LMP  (LMP Unknown)  BP Readings from Last 3 Encounters:  03/31/22 132/84  03/26/22 (!) 162/103  08/12/21 (!) 150/90    Wt Readings from Last 3 Encounters:  03/31/22 (!) 313 lb (142 kg)  03/26/22 (!) 314 lb 9.6 oz (142.7 kg)  04/25/17 285 lb 6.4 oz (129.5 kg)    Physical Exam  ***  No results found for any visits on 04/08/22.  Assessment & Plan     Problem List Items Addressed This Visit   None    No follow-ups on file.         Carlean Jews, NP  [facility] at [facility] [phone] (phone) [phone] (fax)  [facility]

## 2022-04-08 ENCOUNTER — Ambulatory Visit (INDEPENDENT_AMBULATORY_CARE_PROVIDER_SITE_OTHER): Payer: 59 | Admitting: Nurse Practitioner

## 2022-04-08 ENCOUNTER — Encounter: Payer: Self-pay | Admitting: Nurse Practitioner

## 2022-04-08 VITALS — BP 131/84 | HR 76 | Ht 64.96 in | Wt 313.0 lb

## 2022-04-08 DIAGNOSIS — E1159 Type 2 diabetes mellitus with other circulatory complications: Secondary | ICD-10-CM | POA: Diagnosis not present

## 2022-04-08 DIAGNOSIS — E1169 Type 2 diabetes mellitus with other specified complication: Secondary | ICD-10-CM | POA: Insufficient documentation

## 2022-04-08 DIAGNOSIS — I152 Hypertension secondary to endocrine disorders: Secondary | ICD-10-CM | POA: Insufficient documentation

## 2022-04-08 DIAGNOSIS — E559 Vitamin D deficiency, unspecified: Secondary | ICD-10-CM | POA: Diagnosis not present

## 2022-04-08 DIAGNOSIS — E1165 Type 2 diabetes mellitus with hyperglycemia: Secondary | ICD-10-CM | POA: Insufficient documentation

## 2022-04-08 DIAGNOSIS — E785 Hyperlipidemia, unspecified: Secondary | ICD-10-CM | POA: Insufficient documentation

## 2022-04-08 MED ORDER — ERGOCALCIFEROL 1.25 MG (50000 UT) PO CAPS: 50000.0000 [IU] | ORAL_CAPSULE | ORAL | 5 refills | Status: DC

## 2022-04-08 MED ORDER — METFORMIN HCL 500 MG PO TABS: 500.0000 mg | ORAL_TABLET | Freq: Two times a day (BID) | ORAL | 3 refills | Status: DC

## 2022-04-08 MED ORDER — LOSARTAN POTASSIUM 25 MG PO TABS: 12.5000 mg | ORAL_TABLET | Freq: Every day | ORAL | 1 refills | Status: DC

## 2022-05-13 ENCOUNTER — Ambulatory Visit (HOSPITAL_BASED_OUTPATIENT_CLINIC_OR_DEPARTMENT_OTHER): Payer: 59 | Admitting: Nurse Practitioner

## 2022-07-01 ENCOUNTER — Encounter (HOSPITAL_BASED_OUTPATIENT_CLINIC_OR_DEPARTMENT_OTHER): Payer: Self-pay

## 2022-07-08 ENCOUNTER — Ambulatory Visit (INDEPENDENT_AMBULATORY_CARE_PROVIDER_SITE_OTHER): Payer: 59 | Admitting: Nurse Practitioner

## 2022-07-08 ENCOUNTER — Encounter: Payer: Self-pay | Admitting: Nurse Practitioner

## 2022-07-08 VITALS — BP 134/83 | HR 97 | Ht 64.96 in | Wt 315.8 lb

## 2022-07-08 DIAGNOSIS — E1159 Type 2 diabetes mellitus with other circulatory complications: Secondary | ICD-10-CM | POA: Diagnosis not present

## 2022-07-08 DIAGNOSIS — E1165 Type 2 diabetes mellitus with hyperglycemia: Secondary | ICD-10-CM

## 2022-07-08 DIAGNOSIS — I152 Hypertension secondary to endocrine disorders: Secondary | ICD-10-CM | POA: Diagnosis not present

## 2022-07-08 DIAGNOSIS — Z6841 Body Mass Index (BMI) 40.0 and over, adult: Secondary | ICD-10-CM

## 2022-07-08 LAB — POCT GLYCOSYLATED HEMOGLOBIN (HGB A1C): Hemoglobin A1C: 5.8 % — AB (ref 4.0–5.6)

## 2022-07-08 LAB — POCT UA - MICROALBUMIN
Creatinine, POC: 300 mg/dL
Microalbumin Ur, POC: 80 mg/L

## 2022-07-08 NOTE — Progress Notes (Signed)
Established patient visit   Patient: Debra [name]   DOB: [birth year]   31 y.o. Female  MRN: [id number] Visit Date: 07/08/2022   Chief Complaint  Patient presents with   Follow-up   Subjective    HPI  Follow up  -type 2 diabetes  -currently on metformin 500 mg twice daily  -check HgbA1c today is 5.8 - last result was 7.0  -due to have check of urine microalbumin - this is abnormal today showing some protein.  She states that she did have eye exam earlier this year. She goes to [facility] in [place], [place] She has no new concerns or complaints today    Medications: Outpatient Medications Prior to Visit  Medication Sig   ergocalciferol (DRISDOL) 1.25 MG (50000 UT) capsule Take 1 capsule (50,000 Units total) by mouth once a week.   losartan (COZAAR) 25 MG tablet Take 0.5 tablets (12.5 mg total) by mouth daily.   medroxyPROGESTERone (PROVERA) 10 MG tablet Take 1 tablet (10 mg total) by mouth daily.   metFORMIN (GLUCOPHAGE) 500 MG tablet Take 1 tablet (500 mg total) by mouth 2 (two) times daily with a meal.   No facility-administered medications prior to visit.    Review of Systems  Constitutional:  Negative for activity change, appetite change, chills, fatigue and fever.  HENT:  Negative for congestion, postnasal drip, rhinorrhea, sinus pressure, sinus pain, sneezing and sore throat.   Eyes: Negative.   Respiratory:  Negative for cough, chest tightness, shortness of breath and wheezing.   Cardiovascular:  Negative for chest pain and palpitations.  Gastrointestinal:  Negative for abdominal pain, constipation, diarrhea, nausea and vomiting.  Endocrine: Negative for cold intolerance, heat intolerance, polydipsia and polyuria.       Improved blood sugars since last visit   Genitourinary:  Negative for dyspareunia, dysuria, flank pain, frequency and urgency.  Musculoskeletal:  Negative for arthralgias, back pain and myalgias.  Skin:  Negative for rash.  Allergic/Immunologic:  Negative for environmental allergies.  Neurological:  Negative for dizziness, weakness and headaches.  Hematological:  Negative for adenopathy.  Psychiatric/Behavioral:  The patient is not nervous/anxious.     Last CBC Lab Results  Component Value Date   WBC 8.6 03/31/2022   HGB 13.3 03/31/2022   HCT 43.7 03/31/2022   MCV 75 (L) 03/31/2022   MCH 22.9 (L) 03/31/2022   RDW 12.6 03/31/2022   PLT 466 (H) 09/47/0962   Last metabolic panel Lab Results  Component Value Date   GLUCOSE 173 (H) 03/31/2022   NA 139 03/31/2022   K 4.6 03/31/2022   CL 101 03/31/2022   CO2 23 03/31/2022   BUN 12 03/31/2022   CREATININE 0.62 03/31/2022   EGFR 124 03/31/2022   CALCIUM 9.9 03/31/2022   PROT 7.7 03/31/2022   ALBUMIN 4.5 03/31/2022   LABGLOB 3.2 03/31/2022   AGRATIO 1.4 03/31/2022   BILITOT 0.4 03/31/2022   ALKPHOS 79 03/31/2022   AST 22 03/31/2022   ALT 36 (H) 03/31/2022   ANIONGAP 9 04/25/2017   Last lipids Lab Results  Component Value Date   CHOL 223 (H) 03/31/2022   HDL 55 03/31/2022   LDLCALC 147 (H) 03/31/2022   TRIG 120 03/31/2022   CHOLHDL 4.1 03/31/2022   Last hemoglobin A1c Lab Results  Component Value Date   HGBA1C 5.8 (A) 07/08/2022   Last thyroid functions Lab Results  Component Value Date   TSH 3.240 03/31/2022       Objective     Today's Vitals  07/08/22 1451  BP: 134/83  Pulse: 97  SpO2: 100%  Weight: (Abnormal) 315 lb 12.8 oz (143.2 kg)  Height: 5' 4.96" (1.65 m)   Body mass index is 52.62 kg/m.   BP Readings from Last 3 Encounters:  07/08/22 134/83  04/08/22 131/84  03/31/22 132/84    Wt Readings from Last 3 Encounters:  07/08/22 (Abnormal) 315 lb 12.8 oz (143.2 kg)  04/08/22 (Abnormal) 313 lb (142 kg)  03/31/22 (Abnormal) 313 lb (142 kg)    Physical Exam Vitals and nursing note reviewed.  Constitutional:      Appearance: Normal appearance. She is well-developed. She is obese.  HENT:     Head: Normocephalic and atraumatic.      Nose: Nose normal.     Mouth/Throat:     Mouth: Mucous membranes are moist.     Pharynx: Oropharynx is clear.  Eyes:     Extraocular Movements: Extraocular movements intact.     Conjunctiva/sclera: Conjunctivae normal.     Pupils: Pupils are equal, round, and reactive to light.  Cardiovascular:     Rate and Rhythm: Normal rate and regular rhythm.     Pulses: Normal pulses.     Heart sounds: Normal heart sounds.  Pulmonary:     Effort: Pulmonary effort is normal.     Breath sounds: Normal breath sounds.  Abdominal:     Palpations: Abdomen is soft.  Musculoskeletal:        General: Normal range of motion.     Cervical back: Normal range of motion and neck supple.  Lymphadenopathy:     Cervical: No cervical adenopathy.  Skin:    General: Skin is warm and dry.     Capillary Refill: Capillary refill takes less than 2 seconds.  Neurological:     General: No focal deficit present.     Mental Status: She is alert and oriented to person, place, and time.  Psychiatric:        Mood and Affect: Mood normal.        Behavior: Behavior normal.        Thought Content: Thought content normal.        Judgment: Judgment normal.       Results for orders placed or performed in visit on 07/08/22  POCT glycosylated hemoglobin (Hb A1C)  Result Value Ref Range   Hemoglobin A1C 5.8 (A) 4.0 - 5.6 %   HbA1c POC (<> result, manual entry)     HbA1c, POC (prediabetic range)     HbA1c, POC (controlled diabetic range)    POCT UA - Microalbumin  Result Value Ref Range   Microalbumin Ur, POC 80 mg/L   Creatinine, POC 300 mg/dL   Albumin/Creatinine Ratio, Urine, POC 30-30     Assessment & Plan    1. Type 2 diabetes mellitus with hyperglycemia, without long-term current use of insulin (HCC) HgbA1c 5.8 today. She is tolerating metformin well. Abnormal microalbumin. Will monitor this closely. No changes made to medication today. Recheck HgbA1c at next visit in 4 months - POCT glycosylated hemoglobin  (Hb A1C) - POCT UA - Microalbumin  2. Hypertension associated with diabetes ([place]) Blood pressure stable. Continue bp medication as prescribed   3. BMI 50.0-59.9, adult (HCC) Discussed lowering calorie intake to 1500 calories per day and incorporating exercise into daily routine to help lose weight.    Problem List Items Addressed This Visit       Cardiovascular and Mediastinum   Hypertension associated with diabetes ([place])  Relevant Orders   POCT glycosylated hemoglobin (Hb A1C) (Completed)   POCT UA - Microalbumin (Completed)     Endocrine   Type 2 diabetes mellitus with hyperglycemia, without long-term current use of insulin (HCC) - Primary   Relevant Orders   POCT glycosylated hemoglobin (Hb A1C) (Completed)   POCT UA - Microalbumin (Completed)     Other   BMI 50.0-59.9, adult (HCC)     Return in about 4 months (around 11/08/2022) for diabetes with HgbA1c check.         Ronnell Freshwater, NP  [facility] at [facility] [phone] (phone) [phone] (fax)  [place]

## 2022-09-13 ENCOUNTER — Other Ambulatory Visit: Payer: Self-pay

## 2022-09-13 ENCOUNTER — Telehealth: Payer: Self-pay | Admitting: *Deleted

## 2022-09-13 DIAGNOSIS — E559 Vitamin D deficiency, unspecified: Secondary | ICD-10-CM

## 2022-09-13 MED ORDER — ERGOCALCIFEROL 1.25 MG (50000 UT) PO CAPS: 50000.0000 [IU] | ORAL_CAPSULE | ORAL | 5 refills | Status: DC

## 2022-09-13 NOTE — Telephone Encounter (Signed)
Rx has been sent to [facility] on [address]

## 2022-09-13 NOTE — Telephone Encounter (Signed)
Pt called requesting a refill on below medication.   ergocalciferol (DRISDOL) 1.25 MG (50000 UT) capsule   Pharmacy  [id number] - [place], [place] - [address]    LOV:07/08/2022 ROV:11/08/2022

## 2022-09-14 NOTE — Telephone Encounter (Signed)
LVM for pt to call office to inform her of below. Will also send a message to her mychart.Jassiah Viviano Zimmerman Rumple, CMA

## 2022-10-21 ENCOUNTER — Telehealth: Payer: Self-pay | Admitting: *Deleted

## 2022-10-21 DIAGNOSIS — I152 Hypertension secondary to endocrine disorders: Secondary | ICD-10-CM

## 2022-10-21 MED ORDER — LOSARTAN POTASSIUM 25 MG PO TABS: 12.5000 mg | ORAL_TABLET | Freq: Every day | ORAL | 0 refills | Status: DC

## 2022-10-21 NOTE — Telephone Encounter (Signed)
Pt calling requesting refill on below.     losartan (COZAAR) 25 MG tablet   [id number] - [place],  - [address]

## 2022-10-21 NOTE — Telephone Encounter (Signed)
Opened in error.Aayushi Solorzano Zimmerman Rumple, CMA  

## 2022-10-21 NOTE — Telephone Encounter (Signed)
30 day sent 

## 2022-11-08 ENCOUNTER — Telehealth: Payer: Self-pay | Admitting: *Deleted

## 2022-11-08 ENCOUNTER — Ambulatory Visit: Payer: 59 | Admitting: Nurse Practitioner

## 2022-11-08 DIAGNOSIS — E1165 Type 2 diabetes mellitus with hyperglycemia: Secondary | ICD-10-CM

## 2022-11-08 MED ORDER — METFORMIN HCL 500 MG PO TABS: 500.0000 mg | ORAL_TABLET | Freq: Two times a day (BID) | ORAL | 0 refills | Status: DC

## 2022-11-08 NOTE — Telephone Encounter (Signed)
30 day sent 

## 2022-11-08 NOTE — Telephone Encounter (Signed)
Pt called needing to change appointment due to work and she wanted to get refill on below.       metFORMIN (GLUCOPHAGE) 500 MG tablet   [id number] - [place], [place] - [address].   LOV:07/08/22 ROV:12/15/2022

## 2022-11-20 ENCOUNTER — Other Ambulatory Visit: Payer: Self-pay | Admitting: Nurse Practitioner

## 2022-11-20 DIAGNOSIS — E1159 Type 2 diabetes mellitus with other circulatory complications: Secondary | ICD-10-CM

## 2022-12-06 ENCOUNTER — Other Ambulatory Visit: Payer: Self-pay | Admitting: Nurse Practitioner

## 2022-12-06 DIAGNOSIS — E1165 Type 2 diabetes mellitus with hyperglycemia: Secondary | ICD-10-CM

## 2022-12-10 ENCOUNTER — Other Ambulatory Visit: Payer: Self-pay | Admitting: Nurse Practitioner

## 2022-12-10 DIAGNOSIS — E1165 Type 2 diabetes mellitus with hyperglycemia: Secondary | ICD-10-CM

## 2022-12-15 ENCOUNTER — Encounter: Payer: Self-pay | Admitting: Nurse Practitioner

## 2022-12-15 ENCOUNTER — Ambulatory Visit: Payer: 59 | Admitting: Nurse Practitioner

## 2022-12-15 VITALS — BP 129/81 | HR 86 | Ht 64.96 in | Wt 306.4 lb

## 2022-12-15 DIAGNOSIS — E559 Vitamin D deficiency, unspecified: Secondary | ICD-10-CM

## 2022-12-15 DIAGNOSIS — Z6841 Body Mass Index (BMI) 40.0 and over, adult: Secondary | ICD-10-CM

## 2022-12-15 DIAGNOSIS — E1165 Type 2 diabetes mellitus with hyperglycemia: Secondary | ICD-10-CM | POA: Diagnosis not present

## 2022-12-15 DIAGNOSIS — I152 Hypertension secondary to endocrine disorders: Secondary | ICD-10-CM

## 2022-12-15 DIAGNOSIS — E1159 Type 2 diabetes mellitus with other circulatory complications: Secondary | ICD-10-CM

## 2022-12-15 LAB — POCT GLYCOSYLATED HEMOGLOBIN (HGB A1C): HbA1c POC (<> result, manual entry): 6.2 % (ref 4.0–5.6)

## 2022-12-15 MED ORDER — LOSARTAN POTASSIUM 25 MG PO TABS: 12.5000 mg | ORAL_TABLET | Freq: Every day | ORAL | 3 refills | Status: DC

## 2022-12-15 MED ORDER — ERGOCALCIFEROL 1.25 MG (50000 UT) PO CAPS: 50000.0000 [IU] | ORAL_CAPSULE | ORAL | 3 refills | Status: DC

## 2022-12-15 MED ORDER — METFORMIN HCL 500 MG PO TABS: 500.0000 mg | ORAL_TABLET | Freq: Two times a day (BID) | ORAL | 3 refills | Status: DC

## 2022-12-15 NOTE — Progress Notes (Signed)
Established patient visit   Patient: Debra [name]   DOB: [birth year]   32 y.o. Female  MRN: [id number] Visit Date: 12/15/2022   Chief Complaint  Patient presents with   Medical Management of Chronic Issues   Subjective    HPI  Follow up  -DM 2  -HgbA1c 6.2 today  -most recemt HgbA1c done 07/2022 - 5.8  -hypertension  --generally well controlled.  -she has no new concerns or complaints today  -She denies chest pain, chest pressure, or shortness of breath. She denies headaches or visual disturbances. She denies abdominal pain, nausea, vomiting, or changes in bowel or bladder habits.     Medications: Outpatient Medications Prior to Visit  Medication Sig   medroxyPROGESTERone (PROVERA) 10 MG tablet Take 1 tablet (10 mg total) by mouth daily.   [DISCONTINUED] ergocalciferol (DRISDOL) 1.25 MG (50000 UT) capsule Take 1 capsule (50,000 Units total) by mouth once a week.   [DISCONTINUED] losartan (COZAAR) 25 MG tablet Take 0.5 tablets (12.5 mg total) by mouth daily.   [DISCONTINUED] metFORMIN (GLUCOPHAGE) 500 MG tablet Take 1 tablet (500 mg total) by mouth 2 (two) times daily with a meal.   No facility-administered medications prior to visit.    Review of Systems See HPI    Last CBC Lab Results  Component Value Date   WBC 8.6 03/31/2022   HGB 13.3 03/31/2022   HCT 43.7 03/31/2022   MCV 75 (L) 03/31/2022   MCH 22.9 (L) 03/31/2022   RDW 12.6 03/31/2022   PLT 466 (H) [id number]   Last metabolic panel Lab Results  Component Value Date   GLUCOSE 173 (H) 03/31/2022   NA 139 03/31/2022   K 4.6 03/31/2022   CL 101 03/31/2022   CO2 23 03/31/2022   BUN 12 03/31/2022   CREATININE 0.62 03/31/2022   EGFR 124 03/31/2022   CALCIUM 9.9 03/31/2022   PROT 7.7 03/31/2022   ALBUMIN 4.5 03/31/2022   LABGLOB 3.2 03/31/2022   AGRATIO 1.4 03/31/2022   BILITOT 0.4 03/31/2022   ALKPHOS 79 03/31/2022   AST 22 03/31/2022   ALT 36 (H) 03/31/2022   ANIONGAP 9 04/25/2017   Last  lipids Lab Results  Component Value Date   CHOL 223 (H) 03/31/2022   HDL 55 03/31/2022   LDLCALC 147 (H) 03/31/2022   TRIG 120 03/31/2022   CHOLHDL 4.1 03/31/2022   Last hemoglobin A1c Lab Results  Component Value Date   HGBA1C 6.2 12/15/2022   Last thyroid functions Lab Results  Component Value Date   TSH 3.240 03/31/2022   Last vitamin D Lab Results  Component Value Date   VD25OH 8.6 (L) 03/31/2022       Objective     Today's Vitals   12/15/22 1401  BP: 129/81  Pulse: 86  SpO2: 100%  Weight: (Abnormal) 306 lb 6.4 oz (139 kg)  Height: 5' 4.96" (1.65 m)   Body mass index is 51.05 kg/m.  BP Readings from Last 3 Encounters:  12/15/22 129/81  07/08/22 134/83  04/08/22 131/84    Wt Readings from Last 3 Encounters:  12/15/22 (Abnormal) 306 lb 6.4 oz (139 kg)  07/08/22 (Abnormal) 315 lb 12.8 oz (143.2 kg)  04/08/22 (Abnormal) 313 lb (142 kg)    Physical Exam Vitals and nursing note reviewed.  Constitutional:      Appearance: Normal appearance. She is well-developed. She is obese.  HENT:     Head: Normocephalic and atraumatic.     Nose: Nose normal.     Mouth/Throat:  Mouth: Mucous membranes are moist.     Pharynx: Oropharynx is clear.  Eyes:     Extraocular Movements: Extraocular movements intact.     Conjunctiva/sclera: Conjunctivae normal.     Pupils: Pupils are equal, round, and reactive to light.  Cardiovascular:     Rate and Rhythm: Normal rate and regular rhythm.     Pulses: Normal pulses.     Heart sounds: Normal heart sounds.  Pulmonary:     Effort: Pulmonary effort is normal.     Breath sounds: Normal breath sounds.  Abdominal:     Palpations: Abdomen is soft.  Musculoskeletal:        General: Normal range of motion.     Cervical back: Normal range of motion and neck supple.  Lymphadenopathy:     Cervical: No cervical adenopathy.  Skin:    General: Skin is warm and dry.     Capillary Refill: Capillary refill takes less than 2  seconds.  Neurological:     General: No focal deficit present.     Mental Status: She is alert and oriented to person, place, and time.  Psychiatric:        Mood and Affect: Mood normal.        Behavior: Behavior normal.        Thought Content: Thought content normal.        Judgment: Judgment normal.     Results for orders placed or performed in visit on 12/15/22  POCT glycosylated hemoglobin (Hb A1C)  Result Value Ref Range   Hemoglobin A1C     HbA1c POC (<> result, manual entry) 6.2 4.0 - 5.6 %   HbA1c, POC (prediabetic range)     HbA1c, POC (controlled diabetic range)      Assessment & Plan    1. Type 2 diabetes mellitus with hyperglycemia, without long-term current use of insulin (HCC) HgbA1c 6.2 today. Continue metformin 500 mg twice daily. Recheck Hgba1c in 4 months and adjust medication as indicated.  - POCT glycosylated hemoglobin (Hb A1C) - metFORMIN (GLUCOPHAGE) 500 MG tablet; Take 1 tablet (500 mg total) by mouth 2 (two) times daily with a meal.  Dispense: 180 tablet; Refill: 3  2. Hypertension associated with diabetes ([place]) Bp stable. Continue medication as prescribed. Limit salt and increase water in the diet.  - POCT glycosylated hemoglobin (Hb A1C) - losartan (COZAAR) 25 MG tablet; Take 0.5 tablets (12.5 mg total) by mouth daily.  Dispense: 90 tablet; Refill: 3  3. Vitamin D deficiency Very low vitamin d level at last check. Conitnue weekly Drisdol. Recheck Vitamin d level at next visit and continue treatment as indicate.d  - ergocalciferol (DRISDOL) 1.25 MG (50000 UT) capsule; Take 1 capsule (50,000 Units total) by mouth once a week.  Dispense: 12 capsule; Refill: 3  4. BMI 50.0-59.9, adult (HCC) Discussed lowering calorie intake to 1500 calories per day and incorporating exercise into daily routine to help lose weight.    Problem List Items Addressed This Visit       Cardiovascular and Mediastinum   Hypertension associated with diabetes ([place])   Relevant  Medications   metFORMIN (GLUCOPHAGE) 500 MG tablet   losartan (COZAAR) 25 MG tablet   Other Relevant Orders   POCT glycosylated hemoglobin (Hb A1C) (Completed)     Endocrine   Type 2 diabetes mellitus with hyperglycemia, without long-term current use of insulin (HCC) - Primary   Relevant Medications   metFORMIN (GLUCOPHAGE) 500 MG tablet   losartan (COZAAR) 25 MG  tablet   Other Relevant Orders   POCT glycosylated hemoglobin (Hb A1C) (Completed)     Other   BMI 50.0-59.9, adult (HCC)   Relevant Medications   metFORMIN (GLUCOPHAGE) 500 MG tablet   Vitamin D deficiency   Relevant Medications   ergocalciferol (DRISDOL) 1.25 MG (50000 UT) capsule     Return in about 4 months (around 04/16/2023) for DM, FBW a week prior to visit with urine microalbumin .         Ronnell Freshwater, NP  [facility] at [facility] [phone] (phone) [phone] (fax)  [place]

## 2023-04-11 ENCOUNTER — Other Ambulatory Visit: Payer: 59

## 2023-04-18 ENCOUNTER — Ambulatory Visit: Payer: 59 | Admitting: Nurse Practitioner

## 2023-05-09 ENCOUNTER — Encounter: Payer: Self-pay | Admitting: Family Medicine

## 2023-05-09 ENCOUNTER — Ambulatory Visit: Payer: 59 | Admitting: Family Medicine

## 2023-05-09 VITALS — BP 139/85 | HR 82 | Resp 18 | Ht 64.96 in | Wt 308.0 lb

## 2023-05-09 DIAGNOSIS — E785 Hyperlipidemia, unspecified: Secondary | ICD-10-CM | POA: Diagnosis not present

## 2023-05-09 DIAGNOSIS — Z7984 Long term (current) use of oral hypoglycemic drugs: Secondary | ICD-10-CM

## 2023-05-09 DIAGNOSIS — Z1159 Encounter for screening for other viral diseases: Secondary | ICD-10-CM

## 2023-05-09 DIAGNOSIS — E1159 Type 2 diabetes mellitus with other circulatory complications: Secondary | ICD-10-CM

## 2023-05-09 DIAGNOSIS — E1165 Type 2 diabetes mellitus with hyperglycemia: Secondary | ICD-10-CM

## 2023-05-09 DIAGNOSIS — I152 Hypertension secondary to endocrine disorders: Secondary | ICD-10-CM

## 2023-05-09 LAB — POCT GLYCOSYLATED HEMOGLOBIN (HGB A1C): Hemoglobin A1C: 6.3 % — AB (ref 4.0–5.6)

## 2023-05-09 LAB — POCT UA - MICROALBUMIN
Albumin/Creatinine Ratio, Urine, POC: <30
Creatinine, POC: 200 mg/dL
Microalbumin Ur, POC: 30 mg/L

## 2023-05-09 MED ORDER — LOSARTAN POTASSIUM 25 MG PO TABS: 12.5000 mg | ORAL_TABLET | Freq: Every day | ORAL | 3 refills | Status: DC

## 2023-05-09 MED ORDER — METFORMIN HCL 500 MG PO TABS: 500.0000 mg | ORAL_TABLET | Freq: Two times a day (BID) | ORAL | 3 refills | Status: DC

## 2023-05-09 NOTE — Progress Notes (Signed)
Established Patient Office Visit  Subjective   Patient ID: Debra [name], female    DOB: 01-Dec-1990  Age: 32 y.o. MRN: [id number]  Chief Complaint  Patient presents with   Diabetes    HPI Debra [name] is a 32 y.o. female presenting today for follow up of hypertension, hyperlipidemia, diabetes. Hypertension:  Pt denies chest pain, SOB, dizziness, edema, syncope, fatigue or heart palpitations. Taking losartan, reports excellent compliance with treatment. Denies side effects. Hyperlipidemia: Currently consuming a low fat diet.  The ASCVD Risk score (Arnett DK, et al., 2019) failed to calculate for the following reasons:   The 2019 ASCVD risk score is only valid for ages 38 to 33 Diabetes: denies hypoglycemic events, wounds or sores that are not healing well, increased thirst or urination. Denies vision problems, eye exam completed.  Taking metformin as prescribed without any side effects.  She admits that she has not been as closely following low-carb diet over the summer and was not surprised to see that her A1c increased from last check.  ROS Negative unless otherwise noted in HPI   Objective:     BP 139/85 (BP Location: Left Arm, Patient Position: Sitting, Cuff Size: Large)   Pulse 82   Resp 18   Ht 5' 4.96" (1.65 m)   Wt (!) 308 lb (139.7 kg)   SpO2 99%   BMI 51.32 kg/m   Physical Exam Constitutional:      General: She is not in acute distress.    Appearance: Normal appearance.  HENT:     Head: Normocephalic and atraumatic.  Cardiovascular:     Rate and Rhythm: Normal rate and regular rhythm.     Heart sounds: No murmur heard.    No friction rub. No gallop.  Pulmonary:     Effort: Pulmonary effort is normal. No respiratory distress.     Breath sounds: No wheezing, rhonchi or rales.  Skin:    General: Skin is warm and dry.  Neurological:     Mental Status: She is alert and oriented to person, place, and time.    Diabetic Foot Exam - Simple   Simple Foot  Form Diabetic Foot exam was performed with the following findings: Yes 05/09/2023  2:35 PM  Visual Inspection No deformities, no ulcerations, no other skin breakdown bilaterally: Yes Sensation Testing Intact to touch and monofilament testing bilaterally: Yes Pulse Check Posterior Tibialis and Dorsalis pulse intact bilaterally: Yes Comments    Results for orders placed or performed in visit on 05/09/23  POCT HgB A1C  Result Value Ref Range   Hemoglobin A1C 6.3 (A) 4.0 - 5.6 %   HbA1c POC (<> result, manual entry)     HbA1c, POC (prediabetic range)     HbA1c, POC (controlled diabetic range)    POCT UA - Microalbumin  Result Value Ref Range   Microalbumin Ur, POC 30 mg/L   Creatinine, POC 200 mg/dL   Albumin/Creatinine Ratio, Urine, POC <30      Assessment & Plan:  Type 2 diabetes mellitus with hyperglycemia, without long-term current use of insulin (HCC) Assessment & Plan: A1c 6.3, up from 5.8 at last check.  Patient was not surprised given her change in lifestyle over the summer.  We will continue to monitor A1c.  For now, continue metformin 500 mg twice daily. Foot exam completed today.  Urine albumin/creatinine ratio within normal limits.  Orders: -     POCT glycosylated hemoglobin (Hb A1C) -     POCT UA - Microalbumin -  metFORMIN HCl; Take 1 tablet (500 mg total) by mouth 2 (two) times daily with a meal.  Dispense: 180 tablet; Refill: 3  Hypertension associated with diabetes (HCC) Assessment & Plan: BP goal <130/80.  Blood pressure above goal initially.  Continue losartan 12.5 mg daily.  Will continue to monitor.  Orders: -     Losartan Potassium; Take 0.5 tablets (12.5 mg total) by mouth daily.  Dispense: 90 tablet; Refill: 3  Hyperlipidemia LDL goal <100 Assessment & Plan: Continue low-fat diet and routine physical activity.  We will recheck lipid panel with annual physical labs.   Screening for viral disease -     Hepatitis C antibody; Future -     HIV  Antibody (routine testing w rflx); Future  She is agreeable to having tetanus shot updated at next appointment.  Return in about 3 months (around 08/09/2023) for annual physical, fasting blood work 1 week before including HIV/hep C screening.    Melida Quitter, PA

## 2023-05-09 NOTE — Assessment & Plan Note (Addendum)
A1c 6.3, up from 5.8 at last check.  Patient was not surprised given her change in lifestyle over the summer.  We will continue to monitor A1c.  For now, continue metformin 500 mg twice daily. Foot exam completed today.  Urine albumin/creatinine ratio within normal limits.

## 2023-05-09 NOTE — Assessment & Plan Note (Signed)
BP goal <130/80.  Blood pressure above goal initially.  Continue losartan 12.5 mg daily.  Will continue to monitor.

## 2023-05-09 NOTE — Assessment & Plan Note (Signed)
Continue low-fat diet and routine physical activity.  We will recheck lipid panel with annual physical labs.

## 2023-08-01 ENCOUNTER — Other Ambulatory Visit: Payer: Self-pay

## 2023-08-01 ENCOUNTER — Other Ambulatory Visit: Payer: 59

## 2023-08-01 DIAGNOSIS — E1165 Type 2 diabetes mellitus with hyperglycemia: Secondary | ICD-10-CM

## 2023-08-01 DIAGNOSIS — E785 Hyperlipidemia, unspecified: Secondary | ICD-10-CM

## 2023-08-01 DIAGNOSIS — Z1159 Encounter for screening for other viral diseases: Secondary | ICD-10-CM

## 2023-08-01 DIAGNOSIS — R030 Elevated blood-pressure reading, without diagnosis of hypertension: Secondary | ICD-10-CM

## 2023-08-02 LAB — CBC WITH DIFFERENTIAL/PLATELET
Basophils Absolute: 0 10*3/uL (ref 0.0–0.2)
Basos: 0 %
EOS (ABSOLUTE): 0.2 10*3/uL (ref 0.0–0.4)
Eos: 2 %
Hematocrit: 43.1 % (ref 34.0–46.6)
Hemoglobin: 12.7 g/dL (ref 11.1–15.9)
Immature Grans (Abs): 0 10*3/uL (ref 0.0–0.1)
Immature Granulocytes: 0 %
Lymphocytes Absolute: 2.8 10*3/uL (ref 0.7–3.1)
Lymphs: 30 %
MCH: 22.7 pg — ABNORMAL LOW (ref 26.6–33.0)
MCHC: 29.5 g/dL — ABNORMAL LOW (ref 31.5–35.7)
MCV: 77 fL — ABNORMAL LOW (ref 79–97)
Monocytes Absolute: 0.5 10*3/uL (ref 0.1–0.9)
Monocytes: 5 %
Neutrophils Absolute: 5.9 10*3/uL (ref 1.4–7.0)
Neutrophils: 63 %
Platelets: 537 10*3/uL — ABNORMAL HIGH (ref 150–450)
RBC: 5.59 x10E6/uL — ABNORMAL HIGH (ref 3.77–5.28)
RDW: 12.9 % (ref 11.7–15.4)
WBC: 9.4 10*3/uL (ref 3.4–10.8)

## 2023-08-02 LAB — COMPREHENSIVE METABOLIC PANEL
ALT: 32 [IU]/L (ref 0–32)
AST: 23 [IU]/L (ref 0–40)
Albumin: 4.3 g/dL (ref 4.0–5.0)
Alkaline Phosphatase: 80 [IU]/L (ref 44–121)
BUN/Creatinine Ratio: 21 (ref 9–23)
BUN: 15 mg/dL (ref 6–20)
Bilirubin Total: 0.3 mg/dL (ref 0.0–1.2)
CO2: 23 mmol/L (ref 20–29)
Calcium: 10.2 mg/dL (ref 8.7–10.2)
Chloride: 102 mmol/L (ref 96–106)
Creatinine, Ser: 0.71 mg/dL (ref 0.57–1.00)
Globulin, Total: 3 g/dL (ref 1.5–4.5)
Glucose: 112 mg/dL — ABNORMAL HIGH (ref 70–99)
Potassium: 5 mmol/L (ref 3.5–5.2)
Sodium: 141 mmol/L (ref 134–144)
Total Protein: 7.3 g/dL (ref 6.0–8.5)
eGFR: 117 mL/min/{1.73_m2} (ref >59)

## 2023-08-02 LAB — HEMOGLOBIN A1C
Est. average glucose Bld gHb Est-mCnc: 134 mg/dL
Hgb A1c MFr Bld: 6.3 % — ABNORMAL HIGH (ref 4.8–5.6)

## 2023-08-02 LAB — HIV ANTIBODY (ROUTINE TESTING W REFLEX): HIV Screen 4th Generation wRfx: NONREACTIVE

## 2023-08-02 LAB — LIPID PANEL
Chol/HDL Ratio: 3.8 ratio (ref 0.0–4.4)
Cholesterol, Total: 188 mg/dL (ref 100–199)
HDL: 49 mg/dL (ref >39)
LDL Chol Calc (NIH): 122 mg/dL — ABNORMAL HIGH (ref 0–99)
Triglycerides: 96 mg/dL (ref 0–149)
VLDL Cholesterol Cal: 17 mg/dL (ref 5–40)

## 2023-08-02 LAB — HEPATITIS C ANTIBODY: Hep C Virus Ab: NONREACTIVE

## 2023-08-02 LAB — TSH: TSH: 3.67 u[IU]/mL (ref 0.450–4.500)

## 2023-08-09 ENCOUNTER — Ambulatory Visit (INDEPENDENT_AMBULATORY_CARE_PROVIDER_SITE_OTHER): Payer: 59 | Admitting: Family Medicine

## 2023-08-09 ENCOUNTER — Telehealth: Payer: Self-pay

## 2023-08-09 ENCOUNTER — Encounter: Payer: Self-pay | Admitting: Family Medicine

## 2023-08-09 VITALS — BP 141/90 | HR 82 | Resp 18 | Ht 64.96 in | Wt 312.0 lb

## 2023-08-09 DIAGNOSIS — E1159 Type 2 diabetes mellitus with other circulatory complications: Secondary | ICD-10-CM

## 2023-08-09 DIAGNOSIS — E1165 Type 2 diabetes mellitus with hyperglycemia: Secondary | ICD-10-CM | POA: Diagnosis not present

## 2023-08-09 DIAGNOSIS — Z0000 Encounter for general adult medical examination without abnormal findings: Secondary | ICD-10-CM

## 2023-08-09 DIAGNOSIS — I152 Hypertension secondary to endocrine disorders: Secondary | ICD-10-CM

## 2023-08-09 DIAGNOSIS — E785 Hyperlipidemia, unspecified: Secondary | ICD-10-CM

## 2023-08-09 DIAGNOSIS — E1169 Type 2 diabetes mellitus with other specified complication: Secondary | ICD-10-CM | POA: Diagnosis not present

## 2023-08-09 DIAGNOSIS — D75839 Thrombocytosis, unspecified: Secondary | ICD-10-CM

## 2023-08-09 MED ORDER — BLOOD PRESSURE KIT DEVI: 0 refills | Status: DC

## 2023-08-09 NOTE — Telephone Encounter (Signed)
If she can afford it, she can buy 1 over-the-counter as long as it has an arm cuff and not a wrist cuff.  If not, I would encourage her to call her insurance company to see if they offer coverage to support a blood pressure cuff

## 2023-08-09 NOTE — Telephone Encounter (Signed)
Pt called and said that she wasn't able to get the BP cuff at [facility]. They told her that they don't do that.  She wanted to know what you wanted her to do

## 2023-08-09 NOTE — Assessment & Plan Note (Addendum)
BP goal <130/80. Above goal. Continue losartan 12.5 mg daily.  Keep blood pressure log with ambulatory blood pressure monitoring for the next 2 weeks.  If blood pressure remains above goal, increase losartan to 25 mg daily.  Electrolytes and renal function within normal limits.  Will continue to monitor.

## 2023-08-09 NOTE — Progress Notes (Signed)
Complete physical exam  Patient: Debra [name]   DOB: [birth year]   32 y.o. Female  MRN: [id number]  Subjective:    Chief Complaint  Patient presents with   Annual Exam    Debra [name] is a 32 y.o. female who presents today for a complete physical exam. She reports consuming a general diet.  She generally feels well. She reports sleeping well. She does not have additional problems to discuss today.  She is open to getting a tetanus booster on a different day, she needs to mentally prepare for injections.   Most recent fall risk assessment:    05/09/2023    2:18 PM  Fall Risk   Falls in the past year? 0  Number falls in past yr: 0  Injury with Fall? 0  Risk for fall due to : No Fall Risks  Follow up Falls evaluation completed     Most recent depression and anxiety screenings:    05/09/2023    2:18 PM 07/08/2022    3:06 PM  PHQ 2/9 Scores  PHQ - 2 Score 0 0  PHQ- 9 Score 0 0      07/08/2022    3:06 PM 04/08/2022    8:14 AM 03/31/2022    9:25 AM  GAD 7 : Generalized Anxiety Score  Nervous, Anxious, on Edge 0 1 1  Control/stop worrying 0 0 0  Worry too much - different things 1 0 0  Trouble relaxing 0 0 0  Restless 0 0 0  Easily annoyed or irritable 0 0 0  Afraid - awful might happen 0 0 0  Total GAD 7 Score 1 1 1     Patient Active Problem List   Diagnosis Date Noted   Type 2 diabetes mellitus with hyperglycemia, without long-term current use of insulin (HCC) 04/08/2022   Hyperlipidemia associated with type 2 diabetes mellitus (HCC) 04/08/2022   Morbid obesity (HCC) 03/31/2022   Vitamin D deficiency 03/31/2022   Hypertension associated with diabetes (HCC) 03/31/2022    History reviewed. No pertinent surgical history. Social History   Tobacco Use   Smoking status: Never    Passive exposure: Never   Smokeless tobacco: Never  Vaping Use   Vaping status: Never Used  Substance Use Topics   Alcohol use: Not Currently    Comment: Social   Drug use: No    Family History  Problem Relation Age of Onset   Diabetes Paternal Grandfather    Diabetes Paternal Grandmother    Diabetes Maternal Grandmother    Diabetes Maternal Grandfather    Allergies  Allergen Reactions   Penicillins Nausea And Vomiting     Patient Care Team: Melida Quitter, PA as PCP - General (Family Medicine) [facility])   Outpatient Medications Prior to Visit  Medication Sig   ergocalciferol (DRISDOL) 1.25 MG (50000 UT) capsule Take 1 capsule (50,000 Units total) by mouth once a week.   losartan (COZAAR) 25 MG tablet Take 0.5 tablets (12.5 mg total) by mouth daily.   medroxyPROGESTERone (PROVERA) 10 MG tablet Take 1 tablet (10 mg total) by mouth daily.   metFORMIN (GLUCOPHAGE) 500 MG tablet Take 1 tablet (500 mg total) by mouth 2 (two) times daily with a meal.   No facility-administered medications prior to visit.    Review of Systems  Constitutional:  Negative for chills, fever and malaise/fatigue.  HENT:  Negative for congestion and hearing loss.   Eyes:  Negative for blurred vision and double vision.  Respiratory:  Negative for cough and shortness of breath.   Cardiovascular:  Negative for chest pain, palpitations and leg swelling.  Gastrointestinal:  Negative for abdominal pain, constipation, diarrhea and heartburn.  Genitourinary:  Negative for frequency and urgency.  Musculoskeletal:  Negative for myalgias and neck pain.  Neurological:  Negative for headaches.  Endo/Heme/Allergies:  Negative for polydipsia.  Psychiatric/Behavioral:  Negative for depression. The patient is not nervous/anxious and does not have insomnia.       Objective:    BP (!) 141/90 (BP Location: Left Arm, Patient Position: Sitting, Cuff Size: Large)   Pulse 82   Resp 18   Ht 5' 4.96" (1.65 m)   Wt (!) 312 lb (141.5 kg)   LMP 07/12/2023 (Approximate)   SpO2 96%   BMI 51.98 kg/m    Physical Exam Constitutional:      General: She is not in acute  distress.    Appearance: Normal appearance.  HENT:     Head: Normocephalic and atraumatic.     Right Ear: Tympanic membrane, ear canal and external ear normal.     Left Ear: Tympanic membrane, ear canal and external ear normal.     Nose: Nose normal.     Mouth/Throat:     Mouth: Mucous membranes are moist.     Pharynx: No oropharyngeal exudate or posterior oropharyngeal erythema.  Eyes:     Extraocular Movements: Extraocular movements intact.     Conjunctiva/sclera: Conjunctivae normal.     Pupils: Pupils are equal, round, and reactive to light.     Comments: Wearing glasses, prescription up-to-date  Neck:     Thyroid: No thyroid mass, thyromegaly or thyroid tenderness.  Cardiovascular:     Rate and Rhythm: Normal rate and regular rhythm.     Heart sounds: Normal heart sounds. No murmur heard.    No friction rub. No gallop.  Pulmonary:     Effort: Pulmonary effort is normal. No respiratory distress.     Breath sounds: Normal breath sounds. No wheezing, rhonchi or rales.  Abdominal:     General: Abdomen is flat. Bowel sounds are normal. There is no distension.     Palpations: There is no mass.     Tenderness: There is no abdominal tenderness. There is no guarding.     Comments: No organomegaly  Musculoskeletal:        General: Normal range of motion.     Cervical back: Normal range of motion and neck supple.     Right lower leg: No edema.     Left lower leg: No edema.  Lymphadenopathy:     Cervical: No cervical adenopathy.  Skin:    General: Skin is warm and dry.  Neurological:     Mental Status: She is alert and oriented to person, place, and time.     Cranial Nerves: No cranial nerve deficit.     Motor: No weakness.     Deep Tendon Reflexes: Reflexes normal.  Psychiatric:        Mood and Affect: Mood normal.       Assessment & Plan:    Routine Health Maintenance and Physical Exam  Immunization History  Administered Date(s) Administered   Hepatitis B,  PED/ADOLESCENT 01/09/2001, 06/16/2001, 09/13/2002   IPV 01/09/2001, 06/16/2001, 09/13/2002   MMR 01/09/2001, 06/16/2001   Td 01/09/2001, 06/16/2001, 09/13/2002   Varicella 06/16/2001    Health Maintenance  Topic Date Due   OPHTHALMOLOGY EXAM  Never done   COVID-19 Vaccine (1 - 2023-24 season) 08/25/2023 (Originally  06/05/2023)   INFLUENZA VACCINE  01/02/2024 (Originally 05/05/2023)   DTaP/Tdap/Td (4 - Tdap) 08/08/2024 (Originally 02/16/2004)   HEMOGLOBIN A1C  01/30/2024   Diabetic kidney evaluation - Urine ACR  05/08/2024   FOOT EXAM  05/08/2024   Diabetic kidney evaluation - eGFR measurement  07/31/2024   Cervical Cancer Screening (HPV/Pap Cotest)  03/26/2025   Hepatitis C Screening  Completed   HIV Screening  Completed   HPV VACCINES  Aged Out    Reviewed most recent labs including CBC, CMP, lipid panel, A1C, TSH, and vitamin D. All within normal limits/stable from last check other than LDL slightly at 122, A1c stable at 6.3, platelets elevated 537.  Platelets have not been checked since 03/31/2022, and even then they were significantly elevated at 466.  No known history of thrombocytosis. Requesting copy of eye exam.  Discussed health benefits of physical activity, and encouraged her to engage in regular exercise appropriate for her age and condition.  Wellness examination  Hypertension associated with diabetes (HCC) Assessment & Plan: BP goal <130/80. Above goal. Continue losartan 12.5 mg daily.  Keep blood pressure log with ambulatory blood pressure monitoring for the next 2 weeks.  If blood pressure remains above goal, increase losartan to 25 mg daily.  Electrolytes and renal function within normal limits.  Will continue to monitor.  Orders: -     Blood Pressure Kit; Used to check blood pressure 2-3 times daily at least 1 hour after taking blood pressure medication  Dispense: 1 each; Refill: 0  Hyperlipidemia associated with type 2 diabetes mellitus (HCC) Assessment &  Plan: Last lipid panel: LDL 122, HDL 49, triglycerides 96.  LDL above goal of 100 though has improved from last check.  Continue low-fat diet and routine physical activity.     Type 2 diabetes mellitus with hyperglycemia, without long-term current use of insulin (HCC) Assessment & Plan: A1c 6.3, stable.  Continue metformin 500 mg twice daily.  Will continue to monitor.   Thrombocytosis -     Ambulatory referral to Hematology / Oncology    Return in about 4 months (around 12/07/2023) for follow-up for HTN, HLD, DM, POC A1C at visit.     Melida Quitter, PA

## 2023-08-09 NOTE — Patient Instructions (Addendum)
I will send a referral to hematology to figure out what is causing your platelets to be high.  It is located in the [facility] at [facility].  Things to do to keep yourself healthy! - Exercise at least 30-45 minutes a day, 3-4 days a week.  - Eat a low-fat diet with lots of fruits and vegetables, up to 7-9 servings per day.  - Seatbelts can save your life. Wear them always.  - Smoke detectors on every level of your home, check batteries every year.  - Eye Doctor: have an eye exam every 1-2 years, even if you do not wear glasses or contacts. - Safe sex: if you may be exposed to STDs, use a condom.  - Alcohol: If you drink, do it moderately, less than 2 drinks per day.  - Health Care Power of Attorney: choose someone to speak for you if you are not able.  - Depression and anxiety are common in our stressful world. If you're feeling stressed, down, or like you're losing interest in things you normally enjoy, please come in for a visit.  - Violence: If anyone is threatening or hurting you, please call immediately.  Everyone deserves to be safe and loved in all of the relationships.

## 2023-08-09 NOTE — Assessment & Plan Note (Signed)
Last lipid panel: LDL 122, HDL 49, triglycerides 96.  LDL above goal of 100 though has improved from last check.  Continue low-fat diet and routine physical activity.

## 2023-08-09 NOTE — Assessment & Plan Note (Addendum)
A1c 6.3, stable.  Continue metformin 500 mg twice daily.  Will continue to monitor.

## 2023-08-11 ENCOUNTER — Encounter: Payer: Self-pay | Admitting: Family Medicine

## 2023-08-11 MED ORDER — BLOOD PRESSURE KIT DEVI: 0 refills | Status: DC

## 2023-08-11 NOTE — Telephone Encounter (Signed)
Per patient's request, bp monitor was sent to [facility] on [address]

## 2023-08-11 NOTE — Addendum Note (Signed)
Addended by: Tonny Bollman on: 08/11/2023 10:27 AM   Modules accepted: Orders

## 2023-08-29 ENCOUNTER — Encounter: Payer: Self-pay | Admitting: Family Medicine

## 2023-08-29 NOTE — Progress Notes (Signed)
Received copy of blood pressure log from patient.  Blood pressure generally at goal, recommend to continue current medication routine losartan 12.5 mg daily and ambulatory blood pressure monitoring.

## 2023-09-09 ENCOUNTER — Telehealth: Payer: Self-pay | Admitting: Hematology

## 2023-09-13 ENCOUNTER — Inpatient Hospital Stay: Payer: 59

## 2023-09-13 ENCOUNTER — Inpatient Hospital Stay: Payer: 59 | Attending: Hematology | Admitting: Hematology

## 2023-09-13 ENCOUNTER — Encounter: Payer: Self-pay | Admitting: Hematology

## 2023-09-13 VITALS — BP 153/79 | HR 94 | Temp 97.9°F | Resp 18 | Ht 65.0 in | Wt 312.0 lb

## 2023-09-13 DIAGNOSIS — D75839 Thrombocytosis, unspecified: Secondary | ICD-10-CM

## 2023-09-13 DIAGNOSIS — E119 Type 2 diabetes mellitus without complications: Secondary | ICD-10-CM | POA: Insufficient documentation

## 2023-09-13 DIAGNOSIS — D509 Iron deficiency anemia, unspecified: Secondary | ICD-10-CM | POA: Diagnosis not present

## 2023-09-13 LAB — CBC WITH DIFFERENTIAL/PLATELET
Abs Immature Granulocytes: 0.05 10*3/uL (ref 0.00–0.07)
Basophils Absolute: 0 10*3/uL (ref 0.0–0.1)
Basophils Relative: 0 %
Eosinophils Absolute: 0.2 10*3/uL (ref 0.0–0.5)
Eosinophils Relative: 2 %
HCT: 39.5 % (ref 36.0–46.0)
Hemoglobin: 11.6 g/dL — ABNORMAL LOW (ref 12.0–15.0)
Immature Granulocytes: 1 %
Lymphocytes Relative: 32 %
Lymphs Abs: 3.4 10*3/uL (ref 0.7–4.0)
MCH: 22.7 pg — ABNORMAL LOW (ref 26.0–34.0)
MCHC: 29.4 g/dL — ABNORMAL LOW (ref 30.0–36.0)
MCV: 77.1 fL — ABNORMAL LOW (ref 80.0–100.0)
Monocytes Absolute: 0.6 10*3/uL (ref 0.1–1.0)
Monocytes Relative: 6 %
Neutro Abs: 6.3 10*3/uL (ref 1.7–7.7)
Neutrophils Relative %: 59 %
Platelets: 471 10*3/uL — ABNORMAL HIGH (ref 150–400)
RBC: 5.12 MIL/uL — ABNORMAL HIGH (ref 3.87–5.11)
RDW: 14.6 % (ref 11.5–15.5)
WBC: 10.6 10*3/uL — ABNORMAL HIGH (ref 4.0–10.5)
nRBC: 0 % (ref 0.0–0.2)

## 2023-09-13 LAB — IRON AND IRON BINDING CAPACITY (CC-WL,HP ONLY)
Iron: 43 ug/dL (ref 28–170)
Saturation Ratios: 12 % (ref 10.4–31.8)
TIBC: 363 ug/dL (ref 250–450)
UIBC: 320 ug/dL (ref 148–442)

## 2023-09-13 NOTE — Progress Notes (Signed)
[facility]   [phone] Fax:([phone]   Clinic New Consult Note   Patient Care Team: Melida Quitter, [place] as PCP - General (Family Medicine) Davina Poke (Optometry) 09/13/2023  CHIEF COMPLAINTS/PURPOSE OF CONSULTATION:  Thrombocytosis  REFERRING PHYSICIAN: Melida Quitter, PA   Discussed the use of AI scribe software for clinical note transcription with the patient, who gave verbal consent to proceed.  History of Present Illness   Debra [name], a 32 year old female with a history of PCOS and diabetes, presents for evaluation of thrombocytosis.  She presents to the clinic by herself.  She was referred to her PCP.    She has had a mild thrombocytosis since 6 years ago, with platelet count in the range of 460-537K, no history of thrombosis, stroke or heart attack.  She has never been pregnant.  She reports that her menstrual cycle has been regular for the past year without the need for Provera, which she had previously taken to regulate her cycle. She describes her menstrual flow as normal, not heavy, and she does not experience any leakage. She denies any history of anemia. She has been taking metformin for her diabetes and losartan for blood pressure control. She also takes a vitamin D supplement. She denies any history of surgery, cancer, or blood disorders in her family. She does not smoke or use drugs, and she drinks alcohol socially. 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      MEDICAL HISTORY:  Past Medical History:  Diagnosis Date   Allergy    Diabetes mellitus without complication (HCC)    GERD (gastroesophageal reflux disease)    Hyperlipidemia    Morbid obesity (HCC)     SURGICAL HISTORY: History reviewed. No pertinent surgical history.  SOCIAL HISTORY: Social History   Socioeconomic History   Marital status: Single    Spouse name: Not on file   Number of children: 0   Years of education: Not on file    Highest education level: Bachelor's degree (e.g., BA, AB, BS)  Occupational History   Not on file  Tobacco Use   Smoking status: Never    Passive exposure: Never   Smokeless tobacco: Never  Vaping Use   Vaping status: Never Used  Substance and Sexual Activity   Alcohol use: Not Currently    Comment: Social   Drug use: No   Sexual activity: Yes    Birth control/protection: Abstinence, None  Other Topics Concern   Not on file  Social History Narrative   Not on file   Social Determinants of Health   Financial Resource Strain: Low Risk  (08/08/2023)   Overall Financial Resource Strain (CARDIA)    Difficulty of Paying Living Expenses: Not hard at all  Food Insecurity: No Food Insecurity (08/08/2023)   Hunger Vital Sign    Worried About Running Out of Food in the Last Year: Never true    Ran Out of Food in the Last Year: Never true  Transportation Needs: No Transportation Needs (08/08/2023)   PRAPARE - Administrator, Civil Service (Medical): No    Lack of Transportation (Non-Medical): No  Physical Activity: Insufficiently Active (08/08/2023)   Exercise Vital Sign    Days of Exercise per Week: 2 days    Minutes of Exercise per Session: 20 min  Stress: No Stress Concern Present (08/08/2023)   [employer] of Occupational Health - Occupational Stress Questionnaire    Feeling of Stress :  Not at all  Social Connections: Moderately Isolated (08/08/2023)   Social Connection and Isolation Panel [NHANES]    Frequency of Communication with Friends and Family: More than three times a week    Frequency of Social Gatherings with Friends and Family: Twice a week    Attends Religious Services: 1 to 4 times per year    Active Member of [employer] or Organizations: No    Attends Engineer, structural: Not on file    Marital Status: Never married  Catering manager Violence: Not on file    FAMILY HISTORY: Family History  Problem Relation Age of Onset   Diabetes Paternal  Grandfather    Diabetes Paternal Grandmother    Diabetes Maternal Grandmother    Diabetes Maternal Grandfather     ALLERGIES:  is allergic to penicillins.  MEDICATIONS:  Current Outpatient Medications  Medication Sig Dispense Refill   Blood Pressure Monitoring (BLOOD PRESSURE KIT) DEVI Used to check blood pressure 2-3 times daily at least 1 hour after taking blood pressure medication 1 each 0   ergocalciferol (DRISDOL) 1.25 MG (50000 UT) capsule Take 1 capsule (50,000 Units total) by mouth once a week. 12 capsule 3   losartan (COZAAR) 25 MG tablet Take 0.5 tablets (12.5 mg total) by mouth daily. 90 tablet 3   metFORMIN (GLUCOPHAGE) 500 MG tablet Take 1 tablet (500 mg total) by mouth 2 (two) times daily with a meal. 180 tablet 3   No current facility-administered medications for this visit.    REVIEW OF SYSTEMS:   Constitutional: Denies fevers, chills or abnormal night sweats Eyes: Denies blurriness of vision, double vision or watery eyes Ears, nose, mouth, throat, and face: Denies mucositis or sore throat Respiratory: Denies cough, dyspnea or wheezes Cardiovascular: Denies palpitation, chest discomfort or lower extremity swelling Gastrointestinal:  Denies nausea, heartburn or change in bowel habits Skin: Denies abnormal skin rashes Lymphatics: Denies new lymphadenopathy or easy bruising Neurological:Denies numbness, tingling or new weaknesses Behavioral/Psych: [name] is stable, no new changes  All other systems were reviewed with the patient and are negative.  PHYSICAL EXAMINATION: ECOG PERFORMANCE STATUS: 0 - Asymptomatic  Vitals:   09/13/23 1522 09/13/23 1528  BP: (!) 160/76 (!) 153/79  Pulse: 94   Resp: 18   Temp: 97.9 F (36.6 C)   SpO2: 100%    Filed Weights   09/13/23 1522  Weight: (!) 312 lb (141.5 kg)    GENERAL:alert, no distress and comfortable SKIN: skin color, texture, turgor are normal, no rashes or significant lesions EYES: normal, conjunctiva are pink  and non-injected, sclera clear OROPHARYNX:no exudate, no erythema and lips, buccal mucosa, and tongue normal  NECK: supple, thyroid normal size, non-tender, without nodularity LYMPH:  no palpable lymphadenopathy in the cervical, axillary or inguinal LUNGS: clear to auscultation and percussion with normal breathing effort HEART: regular rate & rhythm and no murmurs and no lower extremity edema ABDOMEN:abdomen soft, non-tender and normal bowel sounds Musculoskeletal:no cyanosis of digits and no clubbing  PSYCH: alert & oriented x 3 with fluent speech NEURO: no focal motor/sensory deficits  Physical Exam   CARDIOVASCULAR: Heart sounds normal.      LABORATORY DATA:  I have reviewed the data as listed    Latest Ref Rng & Units 09/13/2023    4:06 PM 08/01/2023    9:03 AM 03/31/2022    9:58 AM  CBC  WBC 4.0 - 10.5 K/uL 10.6  9.4  8.6   Hemoglobin 12.0 - 15.0 g/dL 16.1  12.7  13.3   Hematocrit 36.0 - 46.0 % 39.5  43.1  43.7   Platelets 150 - 400 K/uL 471  537  466     @cmpl @  RADIOGRAPHIC STUDIES: I have personally reviewed the radiological images as listed and agreed with the findings in the report. No results found.  ASSESSMENT & PLAN:  32 year old premenopausal female    Thrombocytosis Chronic thrombocytosis with platelet counts elevated since 2018, currently in 500K range. Likely reactive thrombocytosis secondary to iron deficiency anemia. Essential thrombocytopenia considered but deemed unlikely due to normal WBC and hemoglobin levels. Iron deficiency likely due to chronic blood loss from menstruation. Iron supplementation can improve platelet count and overall blood health. Alternative option of prenatal vitamins if iron pills are not well tolerated. - Order iron level test - Recommend oral iron supplementation with ferrous sulfate 325 mg (65 mg elemental iron) once daily with orange juice - Consider prenatal vitamins if iron pills are not well tolerated - Repeat blood count  in 3-4 months  Microcytosis without anemia  Low MCV indicating microcytic anemia, likely due to chronic blood loss from menstruation despite normal menstrual flow. Iron supplementation can improve anemia and overall blood health. Potential side effects of iron supplementation include constipation and stomach upset. Alternative option of prenatal vitamins if iron pills are not well tolerated. - Order iron level test - Recommend oral iron supplementation with ferrous sulfate 325 mg (65 mg elemental iron) once daily with orange juice - Consider prenatal vitamins if iron pills are not well tolerated - Repeat blood count in 3-4 months  Polycystic Ovary Syndrome (PCOS) PCOS managed with Provera, currently not taking Provera as menstrual cycles have normalized.  Type 2 Diabetes Mellitus Type 2 diabetes managed with metformin.  Hypertension Hypertension managed with losartan.  Plan -Lab today including CBC, ferritin and iron with TIBC -I recommend her to start over-the-counter ferric sulfate once daily, with vitamin C or orange juice - Schedule follow-up appointment in 3-4 months for repeated blood count - Call patient with lab results later this week or next week.         Orders Placed This Encounter  Procedures   CBC with Differential/Platelet    Standing Status:   Standing    Number of Occurrences:   50    Standing Expiration Date:   09/12/2024   Ferritin    Standing Status:   Standing    Number of Occurrences:   20    Standing Expiration Date:   09/12/2024    All questions were answered. The patient knows to call the clinic with any problems, questions or concerns. I spent 20 minutes counseling the patient face to face. The total time spent in the appointment was 30 minutes and more than 50% was on counseling.     Debra Mood, MD 09/13/2023 5:27 PM

## 2023-09-14 LAB — FERRITIN: Ferritin: 34 ng/mL (ref 11–307)

## 2023-09-22 ENCOUNTER — Telehealth: Payer: Self-pay

## 2023-09-22 NOTE — Telephone Encounter (Addendum)
Called patient to relay message below as per Dr. Mosetta Putt. Patient voiced full understanding.  ----- Message from Malachy Mood sent at 09/22/2023 10:16 AM EST ----- Please let pt know her lab results, mild anemia and thrombocytosis, iron level OK, I still recommend her to try OTC iron pill or at least prenatal MVI and repeat lab as scheduled, thx   Malachy Mood

## 2023-11-06 ENCOUNTER — Other Ambulatory Visit: Payer: Self-pay | Admitting: Nurse Practitioner

## 2023-11-06 DIAGNOSIS — E559 Vitamin D deficiency, unspecified: Secondary | ICD-10-CM

## 2023-11-07 NOTE — Telephone Encounter (Signed)
She has been taking prescription strength vitamin D for more than 18 months.  At this point, her vitamin D level should be well within normal range, so she can switch to an over-the-counter vitamin D3 2000 unit daily supplement to maintain vitamin D levels.

## 2023-11-10 ENCOUNTER — Encounter: Payer: Self-pay | Admitting: Family Medicine

## 2023-12-07 ENCOUNTER — Encounter: Payer: Self-pay | Admitting: Family Medicine

## 2023-12-07 ENCOUNTER — Ambulatory Visit: Payer: 59 | Admitting: Family Medicine

## 2023-12-07 VITALS — BP 124/78 | HR 101 | Ht 65.0 in | Wt 318.0 lb

## 2023-12-07 DIAGNOSIS — I152 Hypertension secondary to endocrine disorders: Secondary | ICD-10-CM | POA: Diagnosis not present

## 2023-12-07 DIAGNOSIS — E559 Vitamin D deficiency, unspecified: Secondary | ICD-10-CM

## 2023-12-07 DIAGNOSIS — E1165 Type 2 diabetes mellitus with hyperglycemia: Secondary | ICD-10-CM

## 2023-12-07 DIAGNOSIS — E1159 Type 2 diabetes mellitus with other circulatory complications: Secondary | ICD-10-CM

## 2023-12-07 LAB — POCT GLYCOSYLATED HEMOGLOBIN (HGB A1C)
HbA1c POC (<> result, manual entry): 6.8 % (ref 4.0–5.6)
HbA1c, POC (controlled diabetic range): 6.8 % (ref 0.0–7.0)
Hemoglobin A1C: 6.8 % — AB (ref 4.0–5.6)

## 2023-12-07 MED ORDER — OZEMPIC (0.25 OR 0.5 MG/DOSE) 2 MG/3ML ~~LOC~~ SOPN: 0.5000 mg | PEN_INJECTOR | SUBCUTANEOUS | 1 refills | Status: DC

## 2023-12-07 MED ORDER — ERGOCALCIFEROL 1.25 MG (50000 UT) PO CAPS: 50000.0000 [IU] | ORAL_CAPSULE | ORAL | 0 refills | Status: DC

## 2023-12-07 MED ORDER — OZEMPIC (0.25 OR 0.5 MG/DOSE) 2 MG/3ML ~~LOC~~ SOPN: 0.2500 mg | PEN_INJECTOR | SUBCUTANEOUS | 0 refills | Status: AC

## 2023-12-07 NOTE — Assessment & Plan Note (Addendum)
 A1c 6.8, this is an increase but still at goal of <7.0.  Given side effects, discontinue metformin and start Ozempic 0.25 mg weekly for 1 month before increasing to Ozempic 0.5 mg weekly.  Will continue to monitor.  Requesting eye exam results from [facility] Sheer.

## 2023-12-07 NOTE — Progress Notes (Signed)
 Established Patient Office Visit  Subjective   Patient ID: Debra [name], female    DOB: 31-Aug-1991  Age: 34 y.o. MRN: [id number]  Chief Complaint  Patient presents with   Hypertension   Hyperlipidemia   Diabetes    HPI Debra [name] is a 33 y.o. female presenting today for follow up of hypertension, diabetes. Hypertension: Pt denies chest pain, SOB, dizziness, edema, syncope, fatigue or heart palpitations. Taking losartan 12.5 mg daily, reports excellent compliance with treatment. Denies side effects.  Checks blood pressure at home, average of around 124/78. Diabetes: denies hypoglycemic events, wounds or sores that are not healing well, increased thirst or urination. Denies vision problems, eye exam completed with [facility]. Taking metformin as prescribed with minimal side effects but interested in switching to GLP-1 for improved tolerance.   Outpatient Medications Prior to Visit  Medication Sig Note   Blood Pressure Monitoring (BLOOD PRESSURE KIT) DEVI Used to check blood pressure 2-3 times daily at least 1 hour after taking blood pressure medication    losartan (COZAAR) 25 MG tablet Take 0.5 tablets (12.5 mg total) by mouth daily.    [DISCONTINUED] ergocalciferol (DRISDOL) 1.25 MG (50000 UT) capsule Take 1 capsule (50,000 Units total) by mouth once a week.    [DISCONTINUED] metFORMIN (GLUCOPHAGE) 500 MG tablet Take 1 tablet (500 mg total) by mouth 2 (two) times daily with a meal. 12/07/2023: GI   No facility-administered medications prior to visit.    ROS Negative unless otherwise noted in HPI   Objective:     BP 124/78 Comment: home BP  Pulse (!) 101   Ht 5\' 5"  (1.651 m)   Wt (!) 318 lb (144.2 kg)   SpO2 98%   BMI 52.92 kg/m   Physical Exam Constitutional:      General: She is not in acute distress.    Appearance: Normal appearance.  HENT:     Head: Normocephalic and atraumatic.  Cardiovascular:     Rate and Rhythm: Normal rate and regular rhythm.     Heart  sounds: No murmur heard.    No friction rub. No gallop.  Pulmonary:     Effort: Pulmonary effort is normal. No respiratory distress.     Breath sounds: No wheezing, rhonchi or rales.  Skin:    General: Skin is warm and dry.  Neurological:     Mental Status: She is alert and oriented to person, place, and time.    Results for orders placed or performed in visit on 12/07/23  POCT HgB A1C  Result Value Ref Range   Hemoglobin A1C 6.8 (A) 4.0 - 5.6 %   HbA1c POC (<> result, manual entry) 6.8 4.0 - 5.6 %   HbA1c, POC (prediabetic range)     HbA1c, POC (controlled diabetic range) 6.8 0.0 - 7.0 %     Assessment & Plan:  Hypertension associated with diabetes (HCC) Assessment & Plan: BP goal <130/80. Continue losartan 12.5 mg daily.  Most recent electrolytes and renal function within normal limits.  Will continue to monitor.   Type 2 diabetes mellitus with hyperglycemia, without long-term current use of insulin (HCC) Assessment & Plan: A1c 6.8, this is an increase but still at goal of <7.0.  Given side effects, discontinue metformin and start Ozempic 0.25 mg weekly for 1 month before increasing to Ozempic 0.5 mg weekly.  Will continue to monitor.  Requesting eye exam results from [facility].  Orders: -     POCT glycosylated hemoglobin (Hb A1C) -  Ozempic (0.25 or 0.5 MG/DOSE); Inject 0.25 mg into the skin once a week for 28 days.  Dispense: 3 mL; Refill: 0 -     Ozempic (0.25 or 0.5 MG/DOSE); Inject 0.5 mg into the skin once a week.  Dispense: 3 mL; Refill: 1  Vitamin D deficiency -     Ergocalciferol; Take 1 capsule (50,000 Units total) by mouth once a week. Then switch to OTC vitamin D3 2000 unit daily supplement.  Dispense: 12 capsule; Refill: 0    Return in about 4 months (around 04/07/2024) for follow-up for HTN, HLD, DM, fasting labs 1 week before. Check CBC, CMP, A1C, lipids, vitamin D.   Melida Quitter, PA

## 2023-12-07 NOTE — Assessment & Plan Note (Signed)
 BP goal <130/80. Continue losartan 12.5 mg daily.  Most recent electrolytes and renal function within normal limits.  Will continue to monitor.

## 2023-12-08 NOTE — Telephone Encounter (Signed)
 Copied from CRM [id number]. Topic: General - Other >> Dec 08, 2023 11:31 AM Alphonzo Lemmings O wrote: Reason for CRM: calling from doctor office and need to get a hold to nurse or dr and they sent a medical request . The last time time she was seen  was 01/11/2022 related to anything diabetic they sent it to the nurse or doctor . She was suppose to have another exam in 2024 but she declined the diabetic and just wanted a routine appointment . This is about a medical request sent to [place] . [facility]  [phone] and you can ask to speak with Tresa Endo  Going to lunch will be back at 12:45 from Ms. Tresa Endo

## 2023-12-08 NOTE — Telephone Encounter (Signed)
 Provider informed of below. Will call and let them know that we did get this message. They stated that the last couple of times she has had appt she has only wanted to have a routine eye exam.

## 2023-12-09 ENCOUNTER — Telehealth: Payer: Self-pay | Admitting: Hematology

## 2023-12-09 NOTE — Telephone Encounter (Signed)
 Received patient's voicemail on 12/09/2023 patient wanted to cancel and reschedule appointments; patient is aware of cancelled appointment times/dates

## 2023-12-12 ENCOUNTER — Inpatient Hospital Stay: Payer: Self-pay

## 2023-12-15 ENCOUNTER — Inpatient Hospital Stay: Payer: Self-pay | Admitting: Hematology

## 2024-01-17 ENCOUNTER — Inpatient Hospital Stay (HOSPITAL_BASED_OUTPATIENT_CLINIC_OR_DEPARTMENT_OTHER): Payer: Self-pay | Admitting: Hematology

## 2024-01-17 ENCOUNTER — Inpatient Hospital Stay: Payer: Self-pay | Attending: Internal Medicine

## 2024-01-17 VITALS — BP 138/84 | HR 76 | Temp 97.7°F | Resp 18 | Ht 65.0 in | Wt 318.3 lb

## 2024-01-17 DIAGNOSIS — D582 Other hemoglobinopathies: Secondary | ICD-10-CM | POA: Insufficient documentation

## 2024-01-17 DIAGNOSIS — E611 Iron deficiency: Secondary | ICD-10-CM | POA: Insufficient documentation

## 2024-01-17 DIAGNOSIS — D75839 Thrombocytosis, unspecified: Secondary | ICD-10-CM | POA: Diagnosis present

## 2024-01-17 LAB — CBC WITH DIFFERENTIAL/PLATELET
Abs Immature Granulocytes: 0.03 10*3/uL (ref 0.00–0.07)
Basophils Absolute: 0 10*3/uL (ref 0.0–0.1)
Basophils Relative: 0 %
Eosinophils Absolute: 0.2 10*3/uL (ref 0.0–0.5)
Eosinophils Relative: 2 %
HCT: 42 % (ref 36.0–46.0)
Hemoglobin: 12.8 g/dL (ref 12.0–15.0)
Immature Granulocytes: 0 %
Lymphocytes Relative: 32 %
Lymphs Abs: 2.8 10*3/uL (ref 0.7–4.0)
MCH: 23.1 pg — ABNORMAL LOW (ref 26.0–34.0)
MCHC: 30.5 g/dL (ref 30.0–36.0)
MCV: 75.7 fL — ABNORMAL LOW (ref 80.0–100.0)
Monocytes Absolute: 0.3 10*3/uL (ref 0.1–1.0)
Monocytes Relative: 4 %
Neutro Abs: 5.3 10*3/uL (ref 1.7–7.7)
Neutrophils Relative %: 62 %
Platelets: 508 10*3/uL — ABNORMAL HIGH (ref 150–400)
RBC: 5.55 MIL/uL — ABNORMAL HIGH (ref 3.87–5.11)
RDW: 14.5 % (ref 11.5–15.5)
WBC: 8.7 10*3/uL (ref 4.0–10.5)
nRBC: 0 % (ref 0.0–0.2)

## 2024-01-17 LAB — FERRITIN: Ferritin: 43 ng/mL (ref 11–307)

## 2024-01-17 NOTE — Progress Notes (Signed)
 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 Symptoms, including headaches, have improved with oral iron therapy. Previous evaluations for viral infections and thyroid function were normal. She is clinically well, and IV iron is not required. - Continue oral iron therapy with ferric sulfate once daily. - Monitor blood counts and iron levels through primary care physician. - Follow up with hematology if symptoms worsen or if there is a significant change in blood counts.  Iron Deficiency without anemia  Iron deficiency anemia with significant symptom improvement, including resolution of headaches, since starting oral iron therapy. Current labs show no anemia, but low red blood cell size suggests ongoing iron deficiency. Continuation of oral iron therapy is appropriate. - Continue oral iron therapy with ferric sulfate once daily. - Monitor blood counts and iron levels through primary care physician. - Follow up with hematology if symptoms worsen or if there is a significant change in blood counts.  Slightly Elevated Hemoglobin A1c Slightly elevated hemoglobin A1c, not the primary focus of the visit. - Monitor hemoglobin A1c levels through primary care physician.  Cancer Screening No family history of breast or ovarian cancer. Anticipatory guidance regarding future cancer screenings provided. - Ensure regular Pap smear screenings. - Plan for mammogram screening at age 42.   Plan - She is clinically doing well, her headache and other symptom has improved with  oral iron. - She will continue oral ferrous sulfate once daily - I will see her as needed.  I recommend her check a CBC and iron study every 6 months with her PCP.    Discussed the use of AI scribe software for clinical note transcription with the patient, who gave verbal consent to proceed.  History of Present Illness The patient, a 33 year old with a history of thrombocytosis, presents for a follow-up visit. She reports significant improvement in daily headaches since starting oral iron supplementation. She has been taking ferric sulfate once daily without any issues. She also reports feeling better overall, with increased energy levels. The patient has a history of mild anemia, which has resolved at the time of this visit. However, her platelet count remains slightly elevated at 508. She has been tested for viral infections and thyroid issues, both of which were negative. Her A1c was slightly elevated.     All other systems were reviewed with the patient and are negative.  MEDICAL HISTORY:  Past Medical History:  Diagnosis Date   Allergy    Diabetes mellitus without complication (HCC)    GERD (gastroesophageal reflux disease)    Hyperlipidemia    Morbid obesity (HCC)     SURGICAL HISTORY: No past surgical history on file.  I have reviewed the social history and family history with the patient and they are unchanged from previous note.  ALLERGIES:  is allergic to penicillins.  MEDICATIONS:  Current Outpatient Medications  Medication Sig Dispense Refill   Blood Pressure Monitoring (BLOOD PRESSURE KIT) DEVI Used to check blood pressure 2-3 times daily at least 1 hour after taking blood pressure medication 1 each 0   ergocalciferol (DRISDOL) 1.25 MG (50000 UT)  capsule Take 1 capsule (50,000 Units total) by mouth once a week. Then switch to OTC vitamin D3 2000 unit daily supplement. 12 capsule 0   losartan (COZAAR) 25 MG tablet Take 0.5 tablets (12.5 mg total) by mouth daily. 90  tablet 3   Semaglutide,0.25 or 0.5MG /DOS, (OZEMPIC, 0.25 OR 0.5 MG/DOSE,) 2 MG/3ML SOPN Inject 0.5 mg into the skin once a week. 3 mL 1   No current facility-administered medications for this visit.    PHYSICAL EXAMINATION: ECOG PERFORMANCE STATUS: 0 - Asymptomatic  Vitals:   01/17/24 0936 01/17/24 0938  BP: (!) 146/82 138/84  Pulse: 76   Resp: 18   Temp: 97.7 F (36.5 C)   SpO2: 99%    Wt Readings from Last 3 Encounters:  01/17/24 (!) 318 lb 4.8 oz (144.4 kg)  12/07/23 (!) 318 lb (144.2 kg)  09/13/23 (!) 312 lb (141.5 kg)     GENERAL:alert, no distress and comfortable SKIN: skin color, texture, turgor are normal, no rashes or significant lesions EYES: normal, Conjunctiva are pink and non-injected, sclera clear Musculoskeletal:no cyanosis of digits and no clubbing  NEURO: alert & oriented x 3 with fluent speech, no focal motor/sensory deficits  Physical Exam    LABORATORY DATA:  I have reviewed the data as listed    Latest Ref Rng & Units 01/17/2024    9:15 AM 09/13/2023    4:06 PM 08/01/2023    9:03 AM  CBC  WBC 4.0 - 10.5 K/uL 8.7  10.6  9.4   Hemoglobin 12.0 - 15.0 g/dL 09.8  11.9  14.7   Hematocrit 36.0 - 46.0 % 42.0  39.5  43.1   Platelets 150 - 400 K/uL 508  471  537         Latest Ref Rng & Units 08/01/2023    9:03 AM 03/31/2022    9:58 AM 04/25/2017    9:04 PM  CMP  Glucose 70 - 99 mg/dL 829  562  98   BUN 6 - 20 mg/dL 15  12  15    Creatinine 0.57 - 1.00 mg/dL 1.30  8.65  7.84   Sodium 134 - 144 mmol/L 141  139  139   Potassium 3.5 - 5.2 mmol/L 5.0  4.6  4.0   Chloride 96 - 106 mmol/L 102  101  104   CO2 20 - 29 mmol/L 23  23  26    Calcium 8.7 - 10.2 mg/dL 69.6  9.9  9.0   Total Protein 6.0 - 8.5 g/dL 7.3  7.7  8.0   Total Bilirubin 0.0 - 1.2 mg/dL 0.3  0.4  0.5   Alkaline Phos 44 - 121 IU/L 80  79  57   AST 0 - 40 IU/L 23  22  26    ALT 0 - 32 IU/L 32  36  27       RADIOGRAPHIC STUDIES: I have personally reviewed the radiological images as  listed and agreed with the findings in the report. No results found.    No orders of the defined types were placed in this encounter.  All questions were answered. The patient knows to call the clinic with any problems, questions or concerns. No barriers to learning was detected. The total time spent in the appointment was 15 minutes.     Sonja Alden, MD 01/17/2024

## 2024-03-04 ENCOUNTER — Other Ambulatory Visit: Payer: Self-pay | Admitting: Family Medicine

## 2024-03-04 DIAGNOSIS — E559 Vitamin D deficiency, unspecified: Secondary | ICD-10-CM

## 2024-04-02 ENCOUNTER — Other Ambulatory Visit

## 2024-04-09 ENCOUNTER — Ambulatory Visit

## 2024-05-07 ENCOUNTER — Other Ambulatory Visit: Payer: Self-pay

## 2024-05-07 DIAGNOSIS — E559 Vitamin D deficiency, unspecified: Secondary | ICD-10-CM

## 2024-05-07 DIAGNOSIS — E1169 Type 2 diabetes mellitus with other specified complication: Secondary | ICD-10-CM

## 2024-05-07 DIAGNOSIS — E1159 Type 2 diabetes mellitus with other circulatory complications: Secondary | ICD-10-CM

## 2024-05-07 DIAGNOSIS — D75839 Thrombocytosis, unspecified: Secondary | ICD-10-CM

## 2024-05-07 DIAGNOSIS — E1165 Type 2 diabetes mellitus with hyperglycemia: Secondary | ICD-10-CM

## 2024-05-07 NOTE — Addendum Note (Signed)
 Addended by: ZIMMERMAN RUMPLE, Aahna Rossa D on: 05/07/2024 01:14 PM   Modules accepted: Orders

## 2024-05-08 ENCOUNTER — Other Ambulatory Visit

## 2024-05-08 DIAGNOSIS — E1159 Type 2 diabetes mellitus with other circulatory complications: Secondary | ICD-10-CM

## 2024-05-08 DIAGNOSIS — E1169 Type 2 diabetes mellitus with other specified complication: Secondary | ICD-10-CM

## 2024-05-08 DIAGNOSIS — E1165 Type 2 diabetes mellitus with hyperglycemia: Secondary | ICD-10-CM

## 2024-05-08 DIAGNOSIS — D75839 Thrombocytosis, unspecified: Secondary | ICD-10-CM

## 2024-05-08 DIAGNOSIS — E559 Vitamin D deficiency, unspecified: Secondary | ICD-10-CM

## 2024-05-09 ENCOUNTER — Ambulatory Visit: Payer: Self-pay

## 2024-05-09 LAB — MICROALBUMIN / CREATININE URINE RATIO
Creatinine, Urine: 151 mg/dL (ref 20–275)
Microalb Creat Ratio: 5 mg/g{creat} (ref <30)
Microalb, Ur: 0.7 mg/dL

## 2024-05-09 LAB — LIPID PANEL
Cholesterol: 185 mg/dL (ref <200)
HDL: 47 mg/dL — ABNORMAL LOW (ref >=50)
LDL Cholesterol (Calc): 120 mg/dL — ABNORMAL HIGH
Non-HDL Cholesterol (Calc): 138 mg/dL — ABNORMAL HIGH (ref <130)
Total CHOL/HDL Ratio: 3.9 (calc) (ref <5.0)
Triglycerides: 84 mg/dL (ref <150)

## 2024-05-09 LAB — CBC WITH DIFFERENTIAL/PLATELET
Absolute Lymphocytes: 2822 {cells}/uL (ref 850–3900)
Absolute Monocytes: 481 {cells}/uL (ref 200–950)
Basophils Absolute: 33 {cells}/uL (ref 0–200)
Basophils Relative: 0.4 %
Eosinophils Absolute: 191 {cells}/uL (ref 15–500)
Eosinophils Relative: 2.3 %
HCT: 40.6 % (ref 35.0–45.0)
Hemoglobin: 11.8 g/dL (ref 11.7–15.5)
MCH: 22.6 pg — ABNORMAL LOW (ref 27.0–33.0)
MCHC: 29.1 g/dL — ABNORMAL LOW (ref 32.0–36.0)
MCV: 77.8 fL — ABNORMAL LOW (ref 80.0–100.0)
MPV: 10.6 fL (ref 7.5–12.5)
Monocytes Relative: 5.8 %
Neutro Abs: 4773 {cells}/uL (ref 1500–7800)
Neutrophils Relative %: 57.5 %
Platelets: 497 Thousand/uL — ABNORMAL HIGH (ref 140–400)
RBC: 5.22 Million/uL — ABNORMAL HIGH (ref 3.80–5.10)
RDW: 12.5 % (ref 11.0–15.0)
Total Lymphocyte: 34 %
WBC: 8.3 Thousand/uL (ref 3.8–10.8)

## 2024-05-09 LAB — COMPREHENSIVE METABOLIC PANEL WITH GFR
AG Ratio: 1.6 (calc) (ref 1.0–2.5)
ALT: 21 U/L (ref 6–29)
AST: 16 U/L (ref 10–30)
Albumin: 4.1 g/dL (ref 3.6–5.1)
Alkaline phosphatase (APISO): 52 U/L (ref 31–125)
BUN: 15 mg/dL (ref 7–25)
CO2: 26 mmol/L (ref 20–32)
Calcium: 9.1 mg/dL (ref 8.6–10.2)
Chloride: 104 mmol/L (ref 98–110)
Creat: 0.63 mg/dL (ref 0.50–0.97)
Globulin: 2.6 g/dL (ref 1.9–3.7)
Glucose, Bld: 118 mg/dL — ABNORMAL HIGH (ref 65–99)
Potassium: 4.8 mmol/L (ref 3.5–5.3)
Sodium: 140 mmol/L (ref 135–146)
Total Bilirubin: 0.4 mg/dL (ref 0.2–1.2)
Total Protein: 6.7 g/dL (ref 6.1–8.1)
eGFR: 122 mL/min/1.73m2 (ref >=60)

## 2024-05-09 LAB — TSH: TSH: 2.47 m[IU]/L

## 2024-05-09 LAB — HEMOGLOBIN A1C
Hgb A1c MFr Bld: 6.5 % — ABNORMAL HIGH (ref <5.7)
Mean Plasma Glucose: 140 mg/dL
eAG (mmol/L): 7.7 mmol/L

## 2024-05-09 LAB — VITAMIN D 25 HYDROXY (VIT D DEFICIENCY, FRACTURES): Vit D, 25-Hydroxy: 65 ng/mL (ref 30–100)

## 2024-05-11 LAB — HM DIABETES EYE EXAM

## 2024-05-18 ENCOUNTER — Ambulatory Visit

## 2024-05-18 VITALS — BP 126/82 | HR 87 | Temp 97.8°F | Ht 65.0 in | Wt 310.0 lb

## 2024-05-18 DIAGNOSIS — E559 Vitamin D deficiency, unspecified: Secondary | ICD-10-CM

## 2024-05-18 DIAGNOSIS — E1159 Type 2 diabetes mellitus with other circulatory complications: Secondary | ICD-10-CM

## 2024-05-18 DIAGNOSIS — D75839 Thrombocytosis, unspecified: Secondary | ICD-10-CM

## 2024-05-18 DIAGNOSIS — E1165 Type 2 diabetes mellitus with hyperglycemia: Secondary | ICD-10-CM | POA: Diagnosis not present

## 2024-05-18 DIAGNOSIS — I152 Hypertension secondary to endocrine disorders: Secondary | ICD-10-CM

## 2024-05-18 DIAGNOSIS — E1169 Type 2 diabetes mellitus with other specified complication: Secondary | ICD-10-CM

## 2024-05-18 DIAGNOSIS — Z7984 Long term (current) use of oral hypoglycemic drugs: Secondary | ICD-10-CM

## 2024-05-18 DIAGNOSIS — E785 Hyperlipidemia, unspecified: Secondary | ICD-10-CM

## 2024-05-18 MED ORDER — TIRZEPATIDE 2.5 MG/0.5ML ~~LOC~~ SOAJ: 2.5000 mg | SUBCUTANEOUS | 2 refills | Status: AC

## 2024-05-18 NOTE — Patient Instructions (Signed)
 VISIT SUMMARY: Debra [name], you had a follow-up appointment today to review your lab work and discuss your ongoing health management. Your type 2 diabetes, hypertension, and vitamin D  levels are well-controlled. We also reviewed your cholesterol levels and platelet count.  YOUR PLAN: -TYPE 2 DIABETES MELLITUS: Type 2 diabetes is a condition where your body does not use insulin properly, leading to high blood sugar levels. Your diabetes is well-controlled with an A1c of 6.5%. Continue taking metformin  500 mg twice daily. We will attempt to prescribe Mounjaro  for additional cardiac protection and weight loss, but if it is not covered by your insurance, we will consider Trulicity. An annual foot exam was performed today.  -ESSENTIAL HYPERTENSION: Hypertension, or high blood pressure, is well-controlled with your current medication, losartan  12.5 mg daily. This medication also helps protect your kidneys. Continue monitoring your blood pressure at home periodically.  -HYPERLIPIDEMIA: Hyperlipidemia means you have high levels of lipids (fats) in your blood, specifically LDL cholesterol, which is slightly elevated at 120 mg/dL. We recommend lifestyle changes such as a healthier diet and increased exercise. We will reassess your cholesterol levels in 6 months.  -SECONDARY THROMBOCYTOSIS: Secondary thrombocytosis is a condition where you have a higher than normal platelet count. Your platelet count is stable and slightly lower than before. The hematologist has ruled out other causes. We discussed the link between inflammation and weight loss benefits. Continue monitoring your platelet count.  -GENERAL HEALTH MAINTENANCE: Your vitamin D  levels are stable, and your thyroid, liver function, and electrolytes are normal. Continue your current vitamin D  supplementation.  INSTRUCTIONS: We will check your insurance coverage for Mounjaro  and, if not covered, consider prescribing Trulicity. Reassess your cholesterol  levels in 6 months. Continue periodic home blood pressure monitoring and follow up with any new symptoms or concerns.  If you have any problems before your next visit feel free to message me via MyChart (minor issues or questions) or call the office, otherwise you may reach out to schedule an office visit.  Thank you! Saddie Sacks, PA-C

## 2024-05-18 NOTE — Progress Notes (Signed)
 Established Patient Office Visit  Subjective   Patient ID: Debra [name], female    DOB: [birth year]  Age: 33 y.o. MRN: [id number]  Chief Complaint  Patient presents with   Medical Management of Chronic Issues    HPI  Discussed the use of AI scribe software for clinical note transcription with the patient, who gave verbal consent to proceed.  History of Present Illness   Debra [name] is a 33 year old female with type 2 diabetes who presents for follow-up on lab work.  Glycemic control - Type 2 diabetes managed with metformin  500 mg twice daily - Hemoglobin A1c is 6.5% - No adverse effects or intolerance to metformin   Hypertension - Blood pressure well-controlled with losartan  12.5 mg (half tablet) - Monitors blood pressure at home with consistently good readings - Dietary modifications implemented to support blood pressure control  Hyperlipidemia - LDL cholesterol mildly elevated at 120 mg/dL  Vitamin d  deficiency - Previous vitamin D  deficiency now resolved with ongoing supplementation - Current vitamin D  levels within normal range  Thrombocytosis evaluation - Previously evaluated by hematology for elevated platelet count - No concerning findings identified        ROS Per HPI.    Objective:     BP 126/82   Pulse 87   Temp 97.8 F (36.6 C) (Oral)   Ht 5' 5 (1.651 m)   Wt (!) 310 lb 0.6 oz (140.6 kg)   LMP 04/22/2024   SpO2 100%   BMI 51.59 kg/m    Physical Exam Constitutional:      General: She is not in acute distress.    Appearance: Normal appearance.  Cardiovascular:     Rate and Rhythm: Normal rate and regular rhythm.     Heart sounds: Normal heart sounds. No murmur heard.    No friction rub. No gallop.  Pulmonary:     Effort: Pulmonary effort is normal. No respiratory distress.     Breath sounds: Normal breath sounds.  Musculoskeletal:        General: No swelling.  Skin:    General: Skin is warm and dry.  Neurological:      General: No focal deficit present.     Mental Status: She is alert.  Psychiatric:        Mood and Affect: Mood normal.        Behavior: Behavior normal.        Thought Content: Thought content normal.    No results found for any visits on 05/18/24.  Last CBC Lab Results  Component Value Date   WBC 8.3 05/08/2024   HGB 11.8 05/08/2024   HCT 40.6 05/08/2024   MCV 77.8 (L) 05/08/2024   MCH 22.6 (L) 05/08/2024   RDW 12.5 05/08/2024   PLT 497 (H) 05/08/2024   Last metabolic panel Lab Results  Component Value Date   GLUCOSE 118 (H) 05/08/2024   NA 140 05/08/2024   K 4.8 05/08/2024   CL 104 05/08/2024   CO2 26 05/08/2024   BUN 15 05/08/2024   CREATININE 0.63 05/08/2024   EGFR 122 05/08/2024   CALCIUM 9.1 05/08/2024   PROT 6.7 05/08/2024   ALBUMIN 4.3 08/01/2023   LABGLOB 3.0 08/01/2023   AGRATIO 1.4 03/31/2022   BILITOT 0.4 05/08/2024   ALKPHOS 80 08/01/2023   AST 16 05/08/2024   ALT 21 05/08/2024   ANIONGAP 9 04/25/2017   Last lipids Lab Results  Component Value Date   CHOL 185 05/08/2024   HDL 47 (L)  05/08/2024   LDLCALC 120 (H) 05/08/2024   TRIG 84 05/08/2024   CHOLHDL 3.9 05/08/2024   Last hemoglobin A1c Lab Results  Component Value Date   HGBA1C 6.5 (H) 05/08/2024   Last thyroid functions Lab Results  Component Value Date   TSH 2.47 05/08/2024   Last vitamin D  Lab Results  Component Value Date   VD25OH 65 05/08/2024      The ASCVD Risk score (Arnett DK, et al., 2019) failed to calculate for the following reasons:   The 2019 ASCVD risk score is only valid for ages 54 to 72    Assessment & Plan:   Type 2 diabetes mellitus with hyperglycemia, without long-term current use of insulin (HCC) Assessment & Plan: Diabetes well-controlled with A1c 6.5%. Discussed GLP-1 receptor agonists for cardiac protection and weight loss; insurance coverage is a concern. - Continue metformin  500 mg twice daily. Ozempic  was not covered by insurance - Attempt to  prescribe Mounjaro  and check insurance coverage. - If Mounjaro  is not covered, consider prescribing Trulicity. - Perform annual foot exam today.  Orders: -     Tirzepatide ; Inject 2.5 mg into the skin once a week.  Dispense: 2 mL; Refill: 2  Hypertension associated with diabetes (HCC) Assessment & Plan: BP Goal < 130/80. Blood pressure well-controlled with losartan  12.5 mg. Provides kidney protection beneficial for diabetes. - Continue losartan  12.5 mg daily. - Continue periodic home blood pressure monitoring   Hyperlipidemia associated with type 2 diabetes mellitus (HCC) Assessment & Plan: Last lipid panel: LDL 120, HDL 47, Trig 84. The ASCVD Risk score (Arnett DK, et al., 2019) failed to calculate for the following reasons:   The 2019 ASCVD risk score is only valid for ages 24 to 48 Lifestyle modifications preferred due to mild elevation and young age. - Reassess cholesterol levels in 6 months. - Encourage dietary changes and increased exercise.   Vitamin D  deficiency Assessment & Plan: Vitamin D  WNL. Continue with supplement.   Thrombocytosis Assessment & Plan: Platelet count stable, slightly lower than previous. Hematologist ruled out other causes. Discussed inflammation link and weight loss benefits. - Continue monitoring platelet count.           Return in about 6 months (around 11/18/2024) for HTN, DM, HLD.    Debra JULIANNA Sacks, PA-C

## 2024-05-18 NOTE — Assessment & Plan Note (Signed)
 Vitamin D  WNL. Continue with supplement.

## 2024-05-18 NOTE — Assessment & Plan Note (Signed)
 Diabetes well-controlled with A1c 6.5%. Discussed GLP-1 receptor agonists for cardiac protection and weight loss; insurance coverage is a concern. - Continue metformin  500 mg twice daily. Ozempic  was not covered by insurance - Attempt to prescribe Mounjaro  and check insurance coverage. - If Mounjaro  is not covered, consider prescribing Trulicity. - Perform annual foot exam today.

## 2024-05-18 NOTE — Assessment & Plan Note (Signed)
 Platelet count stable, slightly lower than previous. Hematologist ruled out other causes. Discussed inflammation link and weight loss benefits. - Continue monitoring platelet count.

## 2024-05-18 NOTE — Assessment & Plan Note (Signed)
 Last lipid panel: LDL 120, HDL 47, Trig 84. The ASCVD Risk score (Arnett DK, et al., 2019) failed to calculate for the following reasons:   The 2019 ASCVD risk score is only valid for ages 70 to 32 Lifestyle modifications preferred due to mild elevation and young age. - Reassess cholesterol levels in 6 months. - Encourage dietary changes and increased exercise.

## 2024-05-18 NOTE — Assessment & Plan Note (Signed)
 BP Goal < 130/80. Blood pressure well-controlled with losartan  12.5 mg. Provides kidney protection beneficial for diabetes. - Continue losartan  12.5 mg daily. - Continue periodic home blood pressure monitoring

## 2024-05-29 ENCOUNTER — Other Ambulatory Visit: Payer: Self-pay

## 2024-05-29 DIAGNOSIS — E559 Vitamin D deficiency, unspecified: Secondary | ICD-10-CM

## 2024-06-04 ENCOUNTER — Other Ambulatory Visit: Payer: Self-pay | Admitting: Family Medicine

## 2024-06-04 DIAGNOSIS — E1165 Type 2 diabetes mellitus with hyperglycemia: Secondary | ICD-10-CM

## 2024-06-05 ENCOUNTER — Other Ambulatory Visit: Payer: Self-pay | Admitting: Family Medicine

## 2024-06-05 DIAGNOSIS — E1159 Type 2 diabetes mellitus with other circulatory complications: Secondary | ICD-10-CM

## 2024-07-17 ENCOUNTER — Other Ambulatory Visit: Payer: Self-pay

## 2024-08-20 ENCOUNTER — Other Ambulatory Visit: Payer: Self-pay

## 2024-08-20 DIAGNOSIS — E559 Vitamin D deficiency, unspecified: Secondary | ICD-10-CM

## 2024-11-12 ENCOUNTER — Other Ambulatory Visit

## 2024-11-19 ENCOUNTER — Ambulatory Visit
